# Patient Record
Sex: Female | Born: 1985
Health system: Southern US, Community
[De-identification: ages and names within clinical notes are randomized; demographics above are authoritative.]

## PROBLEM LIST (undated history)

## (undated) DIAGNOSIS — S0300XA Dislocation of jaw, unspecified side, initial encounter: Secondary | ICD-10-CM

## (undated) HISTORY — PX: TUBAL LIGATION: SHX77

---

## 2013-05-29 ENCOUNTER — Encounter (HOSPITAL_COMMUNITY): Payer: Self-pay

## 2013-05-29 ENCOUNTER — Emergency Department (HOSPITAL_COMMUNITY)
Admission: EM | Admit: 2013-05-29 | Discharge: 2013-05-29 | Disposition: A | Discharge status: Home / Self Care | Payer: Medicaid Other | Attending: Emergency Medicine | Admitting: Emergency Medicine

## 2013-05-29 ENCOUNTER — Encounter (HOSPITAL_COMMUNITY): Payer: Self-pay | Admitting: Emergency Medicine

## 2013-05-29 ENCOUNTER — Emergency Department (HOSPITAL_COMMUNITY)
Admission: EM | Admit: 2013-05-29 | Discharge: 2013-05-30 | Disposition: A | Discharge status: Home / Self Care | Payer: Medicaid Other | Source: Home / Self Care

## 2013-05-29 DIAGNOSIS — R51 Headache: Secondary | ICD-10-CM | POA: Insufficient documentation

## 2013-05-29 DIAGNOSIS — M26629 Arthralgia of temporomandibular joint, unspecified side: Secondary | ICD-10-CM | POA: Insufficient documentation

## 2013-05-29 DIAGNOSIS — F17200 Nicotine dependence, unspecified, uncomplicated: Secondary | ICD-10-CM | POA: Insufficient documentation

## 2013-05-29 DIAGNOSIS — K089 Disorder of teeth and supporting structures, unspecified: Secondary | ICD-10-CM | POA: Insufficient documentation

## 2013-05-29 DIAGNOSIS — K029 Dental caries, unspecified: Secondary | ICD-10-CM | POA: Insufficient documentation

## 2013-05-29 HISTORY — DX: Dislocation of jaw, unspecified side, initial encounter: S03.00XA

## 2013-05-29 MED ORDER — IBUPROFEN 800 MG PO TABS: 800.0000 mg | ORAL_TABLET | Freq: Three times a day (TID) | ORAL | Status: DC

## 2013-05-29 MED ORDER — HYDROCODONE-ACETAMINOPHEN 5-325 MG PO TABS: 1.0000 | ORAL_TABLET | ORAL | Status: DC | PRN

## 2013-05-29 MED ORDER — AMOXICILLIN 500 MG PO CAPS: 500.0000 mg | ORAL_CAPSULE | Freq: Three times a day (TID) | ORAL | Status: DC

## 2013-05-29 MED ORDER — HYDROCODONE-ACETAMINOPHEN 5-325 MG PO TABS
1.0000 | ORAL_TABLET | Freq: Once | ORAL | Status: AC
  Administered 2013-05-29: 1 via ORAL
  Filled 2013-05-29: qty 1

## 2013-05-29 NOTE — ED Provider Notes (Signed)
History    CSN: [id number] Arrival date & time 05/29/13  1111  First MD Initiated Contact with Patient 05/29/13 1132     No chief complaint on file.  (Consider location/radiation/quality/duration/timing/severity/associated sxs/prior Treatment) HPI  Maria Deleon is a 27 y.o.female without any significant PMH presents to the ER with complaints of right jaw pain. She does not know if it is her jaw or her tooth. It does not hurt to drink liquid but the chewing motion does hurt.   Symptoms began 3 days ago. The patient has tried to alleviate pain with OTC pain medications.  Pain rated at a 7/10, characterized as throbbing in nature and located right jaw. Patient denies fever, night sweats, chills, difficulty swallowing or opening mouth, SOB, nuchal rigidity or decreased ROM of neck.  Patient does not have a dentist and requests a resource guide at discharge.    History reviewed. No pertinent past medical history. Past Surgical History  Procedure Laterality Date  . Tubal ligation     History reviewed. No pertinent family history. History  Substance Use Topics  . Smoking status: Current Every Day Smoker -- 0.50 packs/day  . Smokeless tobacco: Not on file  . Alcohol Use: Yes   OB History   Grav Para Term Preterm Abortions TAB SAB Ect Mult Living                 Review of Systems  Review of Systems  Gen: no weight loss, fevers, chills, night sweats  Eyes: no discharge or drainage, no occular pain or visual changes  Nose: no epistaxis or rhinorrhea  Mouth: +dental pain, no sore throat  Neck: no neck pain  Lungs:No wheezing, coughing or hemoptysis CV: no chest pain, palpitations, dependent edema or orthopnea  Abd: no abdominal pain, nausea, vomiting  GU: no dysuria or gross hematuria  MSK:  No abnormalities  Neuro: no headache, no focal neurologic deficits  Skin: no abnormalities Psyche: negative.   Allergies  Review of patient's allergies indicates no known  allergies.  Home Medications   Current Outpatient Rx  Name  Route  Sig  Dispense  Refill  . amoxicillin (AMOXIL) 500 MG capsule   Oral   Take 1 capsule (500 mg total) by mouth 3 (three) times daily.   21 capsule   0   . HYDROcodone-acetaminophen (NORCO/VICODIN) 5-325 MG per tablet   Oral   Take 1 tablet by mouth every 4 (four) hours as needed for pain.   20 tablet   0   . ibuprofen (ADVIL,MOTRIN) 800 MG tablet   Oral   Take 1 tablet (800 mg total) by mouth 3 (three) times daily.   21 tablet   0    BP 118/72  Pulse 83  Temp(Src) 98.4 F (36.9 C) (Oral)  Resp 18  SpO2 97%  LMP 04/29/2013 Physical Exam  Constitutional: She appears well-developed and well-nourished. No distress.  HENT:  Head: Normocephalic and atraumatic.  Mouth/Throat: Uvula is midline, oropharynx is clear and moist and mucous membranes are normal. Normal dentition. Dental caries (Pts tooth shows no obvious abscess but moderate to severe tenderness to palpation of marked tooth) present. No edematous.  Eyes: Pupils are equal, round, and reactive to light.  Neck: Trachea normal, normal range of motion and full passive range of motion without pain. Neck supple.  Cardiovascular: Normal rate, regular rhythm, normal heart sounds and normal pulses.   Pulmonary/Chest: Effort normal and breath sounds normal. No respiratory distress. Chest wall is not dull  to percussion. She exhibits no tenderness, no crepitus, no edema, no deformity and no retraction.  Abdominal: Normal appearance.  Musculoskeletal: Normal range of motion.  Neurological: She is alert. She has normal strength.  Skin: Skin is warm, dry and intact. She is not diaphoretic.  Psychiatric: She has a normal mood and affect. Her speech is normal. Cognition and memory are normal.    ED Course  Procedures (including critical care time) Labs Reviewed - No data to display No results found. 1. TMJ arthralgia     MDM  Patient has dental pain. No emergent  s/sx's present. Patent airway. No trismus.  Will be given pain medication and antibiotics. I discussed the need to call dentist within 24/48 hours for follow-up. Dental referral given. Return to ED precautions given.  Pt voiced understanding and has agreed to follow-up.    Dorthula Matas, PA-C 05/29/13 1202

## 2013-05-29 NOTE — ED Notes (Signed)
Pt presents with 3 day h/o R sided jaw pain and "swollen gums".  Pt denies any dental caries, reports "my wisdom tooth may be coming in".

## 2013-05-29 NOTE — ED Notes (Signed)
PT. REPORTS PERSISTENT RIGHT JAW PAIN SEEN HERE THIS MORNING DIAGNOSED WITH TMJ ARTHRALGIA PRESCRIBED WITH IBUPROFEN , VICODIN AND ANTIBIOTIC WITH NO IMPROVEMENT.

## 2013-05-29 NOTE — ED Provider Notes (Signed)
Medical screening examination/treatment/procedure(s) were performed by non-physician practitioner and as supervising physician I was immediately available for consultation/collaboration.   Laranda Burkemper B. Dandre Sisler, MD 05/29/13 1525 

## 2013-05-30 ENCOUNTER — Emergency Department (HOSPITAL_COMMUNITY): Payer: Medicaid Other

## 2013-05-30 IMAGING — CT CT MAXILLOFACIAL W/O CM
3 series · 16 of 47 positions shown, 19 images · non-contrast
Comparison: None.

CLINICAL DATA: Right-sided facial pain for 3 days.  The patient is
unable to eat.

CT MAXILLOFACIAL WITHOUT CONTRAST
TECHNIQUE: Multidetector CT imaging of the maxillofacial
structures was performed. Multiplanar CT image reconstructions were
also generated.

[Series 2: facial/ orbits 2.0 h30s · axial · 0.32mm/px · z∈[-150,-22]mm · 10 of 76 slices shown, 13 images]
[im 6/76  brain]
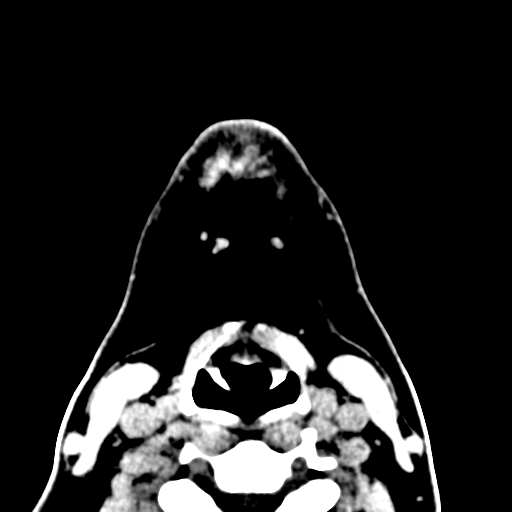
[im 6/76  bone]
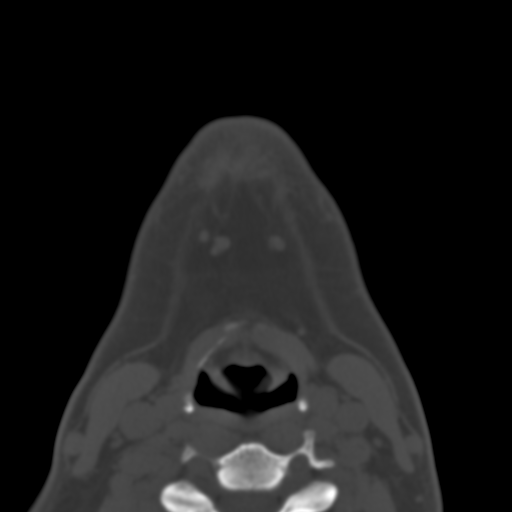
[im 13/76  bone]
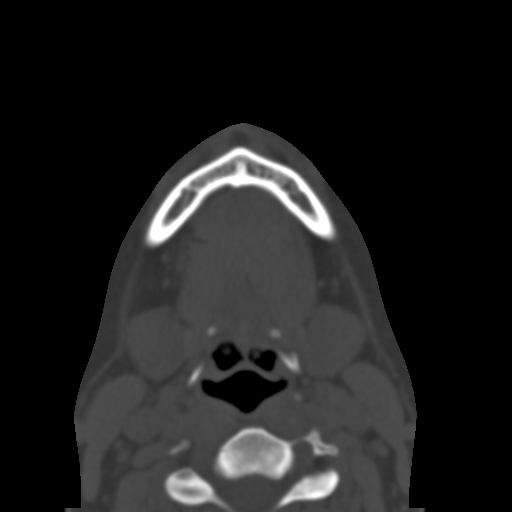
[im 21/76  bone]
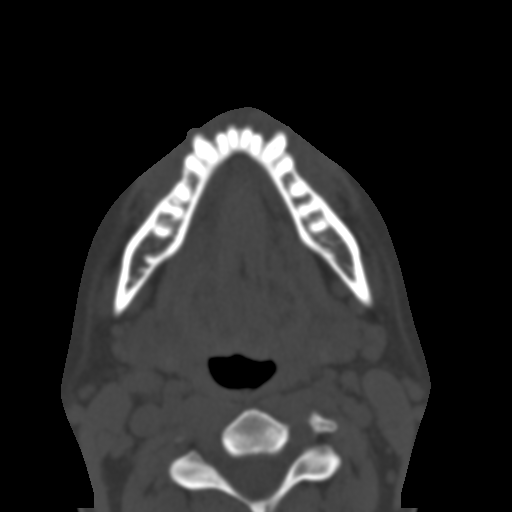
[im 26/76  bone]
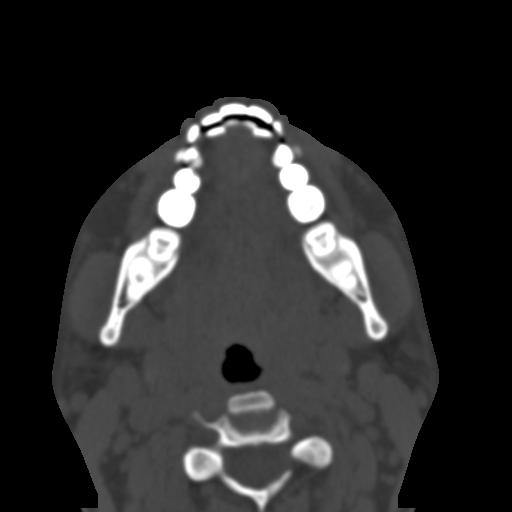
[im 34/76  brain]
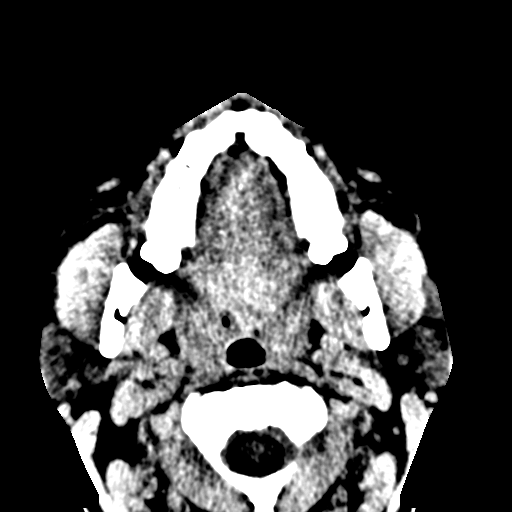
[im 34/76  bone]
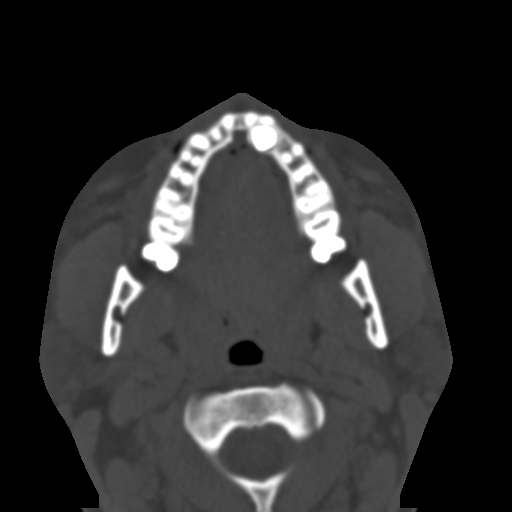
[im 42/76  bone]
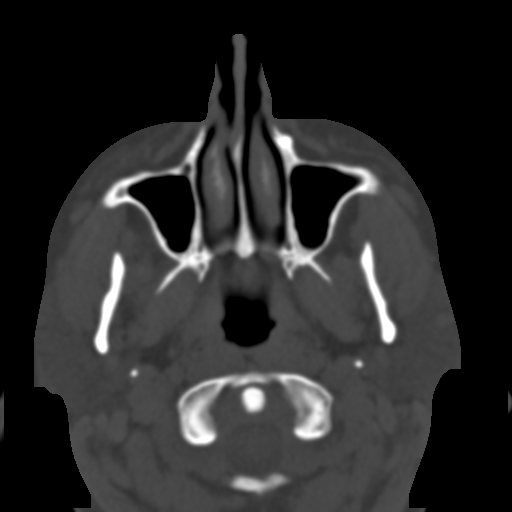
[im 50/76  bone]
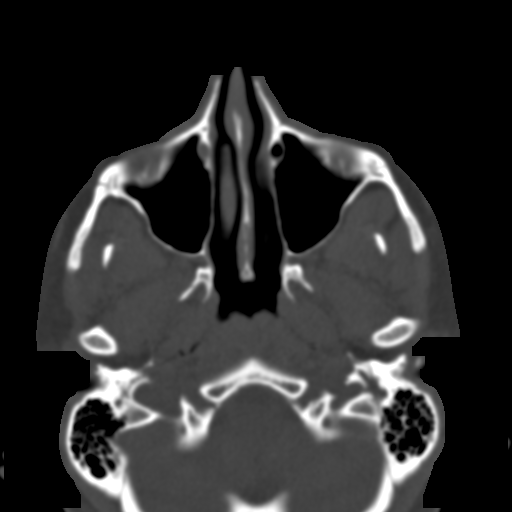
[im 57/76  bone]
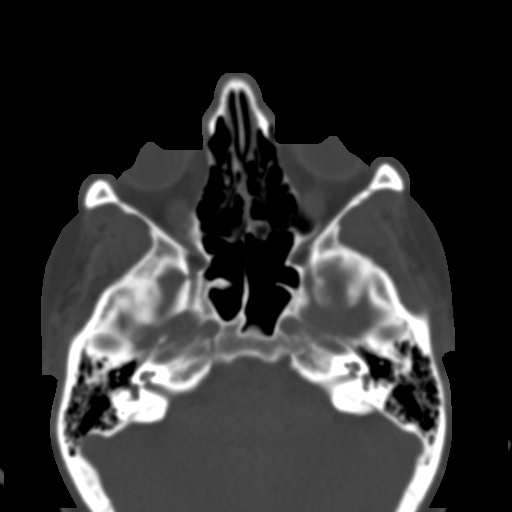
[im 63/76  brain]
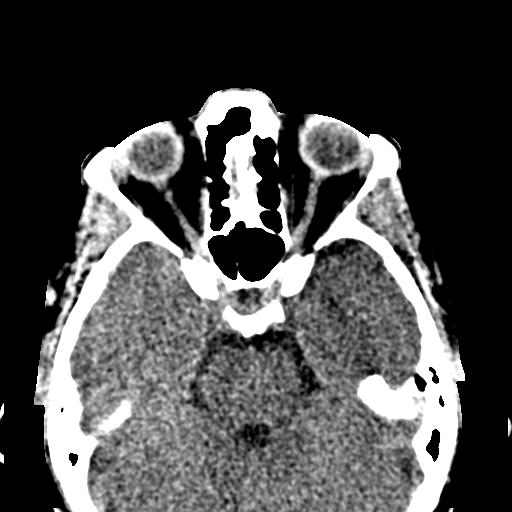
[im 63/76  bone]
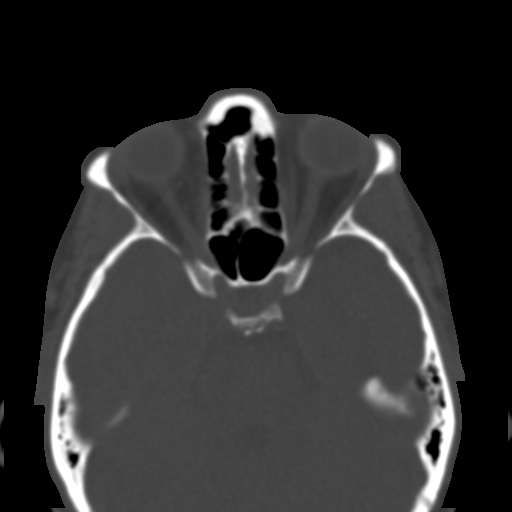
[im 70/76  bone]
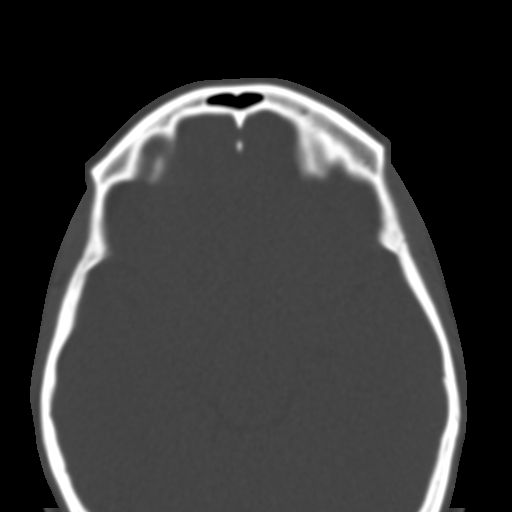

[Series 602: coronal st · coronal · 0.32mm/px · 3 of 74 slices shown]
[im 25/74  bone]
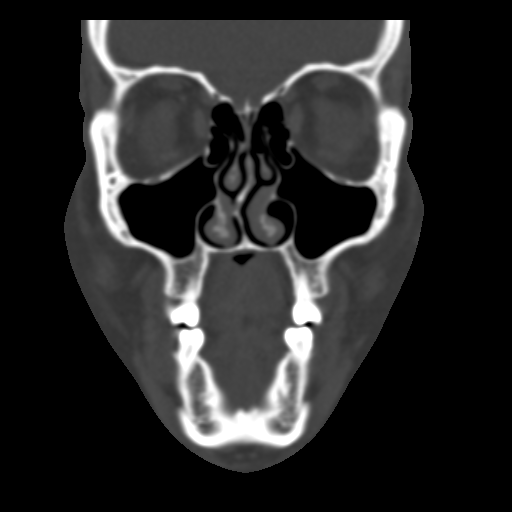
[im 33/74  bone]
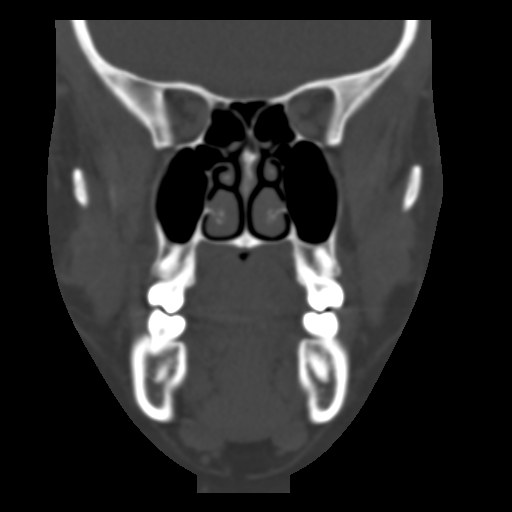
[im 41/74  bone]
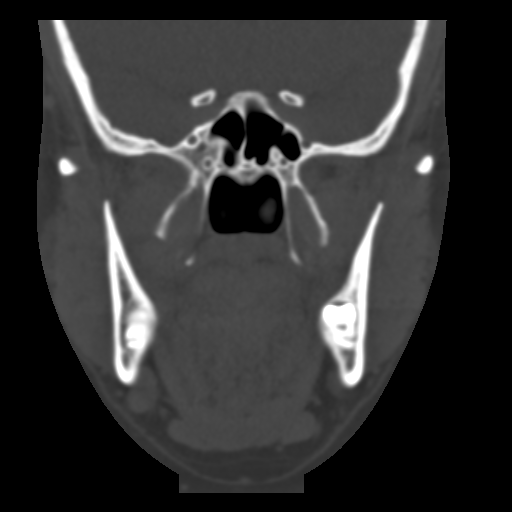

[Series 603: sag st · sagittal · 0.32mm/px · 3 of 73 slices shown]
[im 25/73  bone]
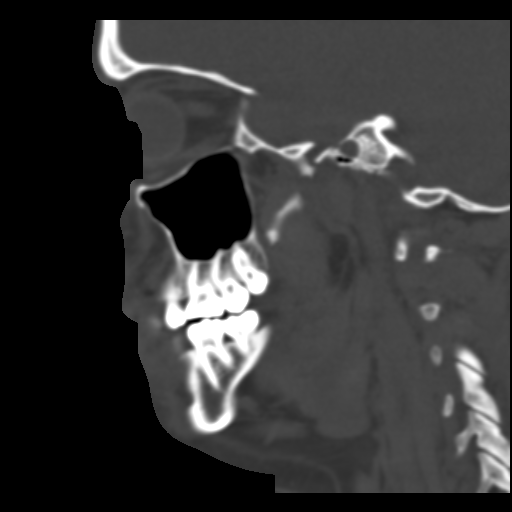
[im 37/73  bone]
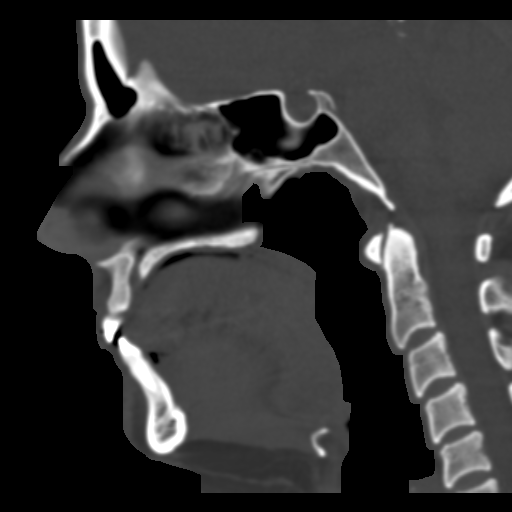
[im 49/73  bone]
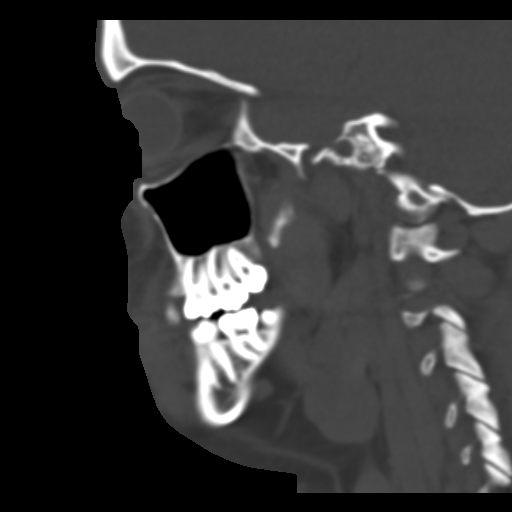

[16 of 47 positions shown; findings below may reference images not displayed]

FINDINGS: The globes and extraocular muscles appear intact and
symmetrical.  There is a small retention cyst in the right
maxillary antrum but otherwise the paranasal sinuses are clear.  No
acute air-fluid levels demonstrated.  The orbital, nasal, and
facial bones appear intact.  No displaced fractures are
appreciated.  The lower third molars are partially erupted
bilaterally.  Upper third molars appear to be erupted.  There is an
unerrupted tooth in the anterior left maxilla which probably
represents the left upper cuspid. No significant periodontal bone
erosion is suggested.  No significant soft tissue swelling.
Salivary glands appear symmetrical.  Cervical lymph nodes are not
pathologically enlarged.
IMPRESSION: No evidence of acute abscess.

## 2013-05-30 MED ORDER — OXYCODONE-ACETAMINOPHEN 5-325 MG PO TABS: 1.0000 | ORAL_TABLET | Freq: Once | ORAL | Status: DC

## 2013-05-30 MED ORDER — HYDROMORPHONE HCL PF 1 MG/ML IJ SOLN
1.0000 mg | Freq: Once | INTRAMUSCULAR | Status: AC
  Administered 2013-05-30: 1 mg via INTRAMUSCULAR
  Filled 2013-05-30: qty 1

## 2013-05-30 MED ORDER — OXYCODONE-ACETAMINOPHEN 5-325 MG PO TABS: 1.0000 | ORAL_TABLET | ORAL | Status: DC | PRN

## 2013-05-30 NOTE — ED Notes (Signed)
Pt returned from CT °

## 2013-05-30 NOTE — ED Notes (Signed)
Pt comfortable with d/c and f/u instructions. 

## 2013-05-30 NOTE — ED Provider Notes (Signed)
History    CSN: [id number] Arrival date & time 05/29/13  2151  None    Chief Complaint  Patient presents with  . Jaw Pain   (Consider location/radiation/quality/duration/timing/severity/associated sxs/prior Treatment) HPI History provided by pt and prior chart.  Pt c/o severe pain in right jaw and cheek for the past 3 days.  Pain aggravated by talking and chewing.  No associated fever, skin changes, nasal congestion, toothache, sore throat, otalgia.  Denies trauma.  Per prior chart, pt seen for same yesterday am, was clinically diagnosed w/ TMJ and d/c'd home w/ vicodin and amoxicillin.  Pt reports no relief w/ vicodin.   Past Medical History  Diagnosis Date  . TMJ (dislocation of temporomandibular joint)    Past Surgical History  Procedure Laterality Date  . Tubal ligation     No family history on file. History  Substance Use Topics  . Smoking status: Current Every Day Smoker -- 0.50 packs/day  . Smokeless tobacco: Not on file  . Alcohol Use: Yes   OB History   Grav Para Term Preterm Abortions TAB SAB Ect Mult Living                 Review of Systems  All other systems reviewed and are negative.    Allergies  Review of patient's allergies indicates no known allergies.  Home Medications   Current Outpatient Rx  Name  Route  Sig  Dispense  Refill  . amoxicillin (AMOXIL) 500 MG capsule   Oral   Take 1 capsule (500 mg total) by mouth 3 (three) times daily.   21 capsule   0   . HYDROcodone-acetaminophen (NORCO/VICODIN) 5-325 MG per tablet   Oral   Take 1 tablet by mouth every 4 (four) hours as needed for pain.   20 tablet   0   . ibuprofen (ADVIL,MOTRIN) 800 MG tablet   Oral   Take 1 tablet (800 mg total) by mouth 3 (three) times daily.   21 tablet   0   . oxyCODONE-acetaminophen (PERCOCET/ROXICET) 5-325 MG per tablet   Oral   Take 1 tablet by mouth every 4 (four) hours as needed for pain.   12 tablet   0    BP 119/74  Pulse 74  Temp(Src) 98.4 F  (36.9 C) (Oral)  Resp 16  SpO2 97%  LMP 04/29/2013 Physical Exam  Nursing note and vitals reviewed. Constitutional: She is oriented to person, place, and time. She appears well-developed and well-nourished. No distress.  HENT:  Head: Normocephalic and atraumatic.  No edema or skin changes of face.  Tenderness of entire right maxilla and mandible, including TMJ, as well as buccal mucosa and all teeth.  Mild, symmetric edema of tonsils.  Uvula mid-line.  No true trismus, but opening mouth very uncomfortable.   Eyes:  Normal appearance  Neck: Normal range of motion.  Cardiovascular: Normal rate and regular rhythm.   Pulmonary/Chest: Effort normal and breath sounds normal. No respiratory distress.  Musculoskeletal: Normal range of motion.  Lymphadenopathy:    She has no cervical adenopathy.  Neurological: She is alert and oriented to person, place, and time.  Skin: Skin is warm and dry. No rash noted.  Psychiatric: She has a normal mood and affect. Her behavior is normal.    ED Course  Procedures (including critical care time) Labs Reviewed - No data to display Ct Maxillofacial Wo Cm  05/30/2013   *RADIOLOGY REPORT*  Clinical Data: Right-sided facial pain for 3 days.  The patient is unable to eat.  CT MAXILLOFACIAL WITHOUT CONTRAST  Technique:  Multidetector CT imaging of the maxillofacial structures was performed. Multiplanar CT image reconstructions were also generated.  Comparison: None.  Findings: The globes and extraocular muscles appear intact and symmetrical.  There is a small retention cyst in the right maxillary antrum but otherwise the paranasal sinuses are clear.  No acute air-fluid levels demonstrated.  The orbital, nasal, and facial bones appear intact.  No displaced fractures are appreciated.  The lower third molars are partially erupted bilaterally.  Upper third molars appear to be erupted.  There is an unerrupted tooth in the anterior left maxilla which probably represents the  left upper cuspid. No significant periodontal bone erosion is suggested.  No significant soft tissue swelling. Salivary glands appear symmetrical.  Cervical lymph nodes are not pathologically enlarged.  IMPRESSION: No evidence of acute abscess.   Original Report Authenticated By: Burman Nieves, M.D.   1. Facial pain     MDM  27yo F presents for second time in 24 hours w/ non-traumatic, right-sided facial pain.  No significant findings on exam.  Discussed w/ Dr. Dierdre Highman who recommended CT maxillofacial to look for obvious sinusitis.  Non-acute.  Results discussed w/ pt.  She had some relief of pain w/ IM dialudid.  On re-examination, tenderness most prominent at right parotid gland duct.  No obvious stone or inflammatory process on CT, but recommended warm compresses, sour candy and massage.  Prescribed 12 percocet to take in place of vicodin (has given her no relief; pt very reasonable) and referred to ENT.  Return precautions discussed.   Otilio Miu, PA-C 05/30/13 [phone]

## 2013-06-01 NOTE — ED Provider Notes (Signed)
Medical screening examination/treatment/procedure(s) were performed by non-physician practitioner and as supervising physician I was immediately available for consultation/collaboration.  Naoki Migliaccio, MD 06/01/13 0719 

## 2013-07-31 DIAGNOSIS — G4700 Insomnia, unspecified: Secondary | ICD-10-CM | POA: Insufficient documentation

## 2013-07-31 DIAGNOSIS — M722 Plantar fascial fibromatosis: Secondary | ICD-10-CM | POA: Insufficient documentation

## 2013-08-14 DIAGNOSIS — R5383 Other fatigue: Secondary | ICD-10-CM | POA: Insufficient documentation

## 2013-08-14 DIAGNOSIS — R768 Other specified abnormal immunological findings in serum: Secondary | ICD-10-CM | POA: Insufficient documentation

## 2013-08-14 DIAGNOSIS — M2550 Pain in unspecified joint: Secondary | ICD-10-CM | POA: Insufficient documentation

## 2013-09-25 DIAGNOSIS — M064 Inflammatory polyarthropathy: Secondary | ICD-10-CM | POA: Insufficient documentation

## 2013-09-25 DIAGNOSIS — M25531 Pain in right wrist: Secondary | ICD-10-CM | POA: Insufficient documentation

## 2013-09-25 DIAGNOSIS — M659 Synovitis and tenosynovitis, unspecified: Secondary | ICD-10-CM | POA: Insufficient documentation

## 2014-03-12 ENCOUNTER — Encounter (HOSPITAL_COMMUNITY): Payer: Self-pay | Admitting: Emergency Medicine

## 2014-03-12 ENCOUNTER — Emergency Department (HOSPITAL_COMMUNITY): Payer: Medicaid Other

## 2014-03-12 ENCOUNTER — Emergency Department (HOSPITAL_COMMUNITY)
Admission: EM | Admit: 2014-03-12 | Discharge: 2014-03-12 | Disposition: A | Discharge status: Home / Self Care | Payer: Medicaid Other | Attending: Emergency Medicine | Admitting: Emergency Medicine

## 2014-03-12 DIAGNOSIS — R091 Pleurisy: Secondary | ICD-10-CM | POA: Insufficient documentation

## 2014-03-12 DIAGNOSIS — Z79899 Other long term (current) drug therapy: Secondary | ICD-10-CM | POA: Insufficient documentation

## 2014-03-12 DIAGNOSIS — F17200 Nicotine dependence, unspecified, uncomplicated: Secondary | ICD-10-CM | POA: Insufficient documentation

## 2014-03-12 DIAGNOSIS — G479 Sleep disorder, unspecified: Secondary | ICD-10-CM | POA: Insufficient documentation

## 2014-03-12 DIAGNOSIS — Z87828 Personal history of other (healed) physical injury and trauma: Secondary | ICD-10-CM | POA: Insufficient documentation

## 2014-03-12 IMAGING — CR DG CHEST 2V
2 series · 2 of 2 positions shown · non-contrast
Comparison: None.

CLINICAL DATA: Right flank pain, left shoulder pain

EXAM:
CHEST  2 VIEW

[w chest pa]
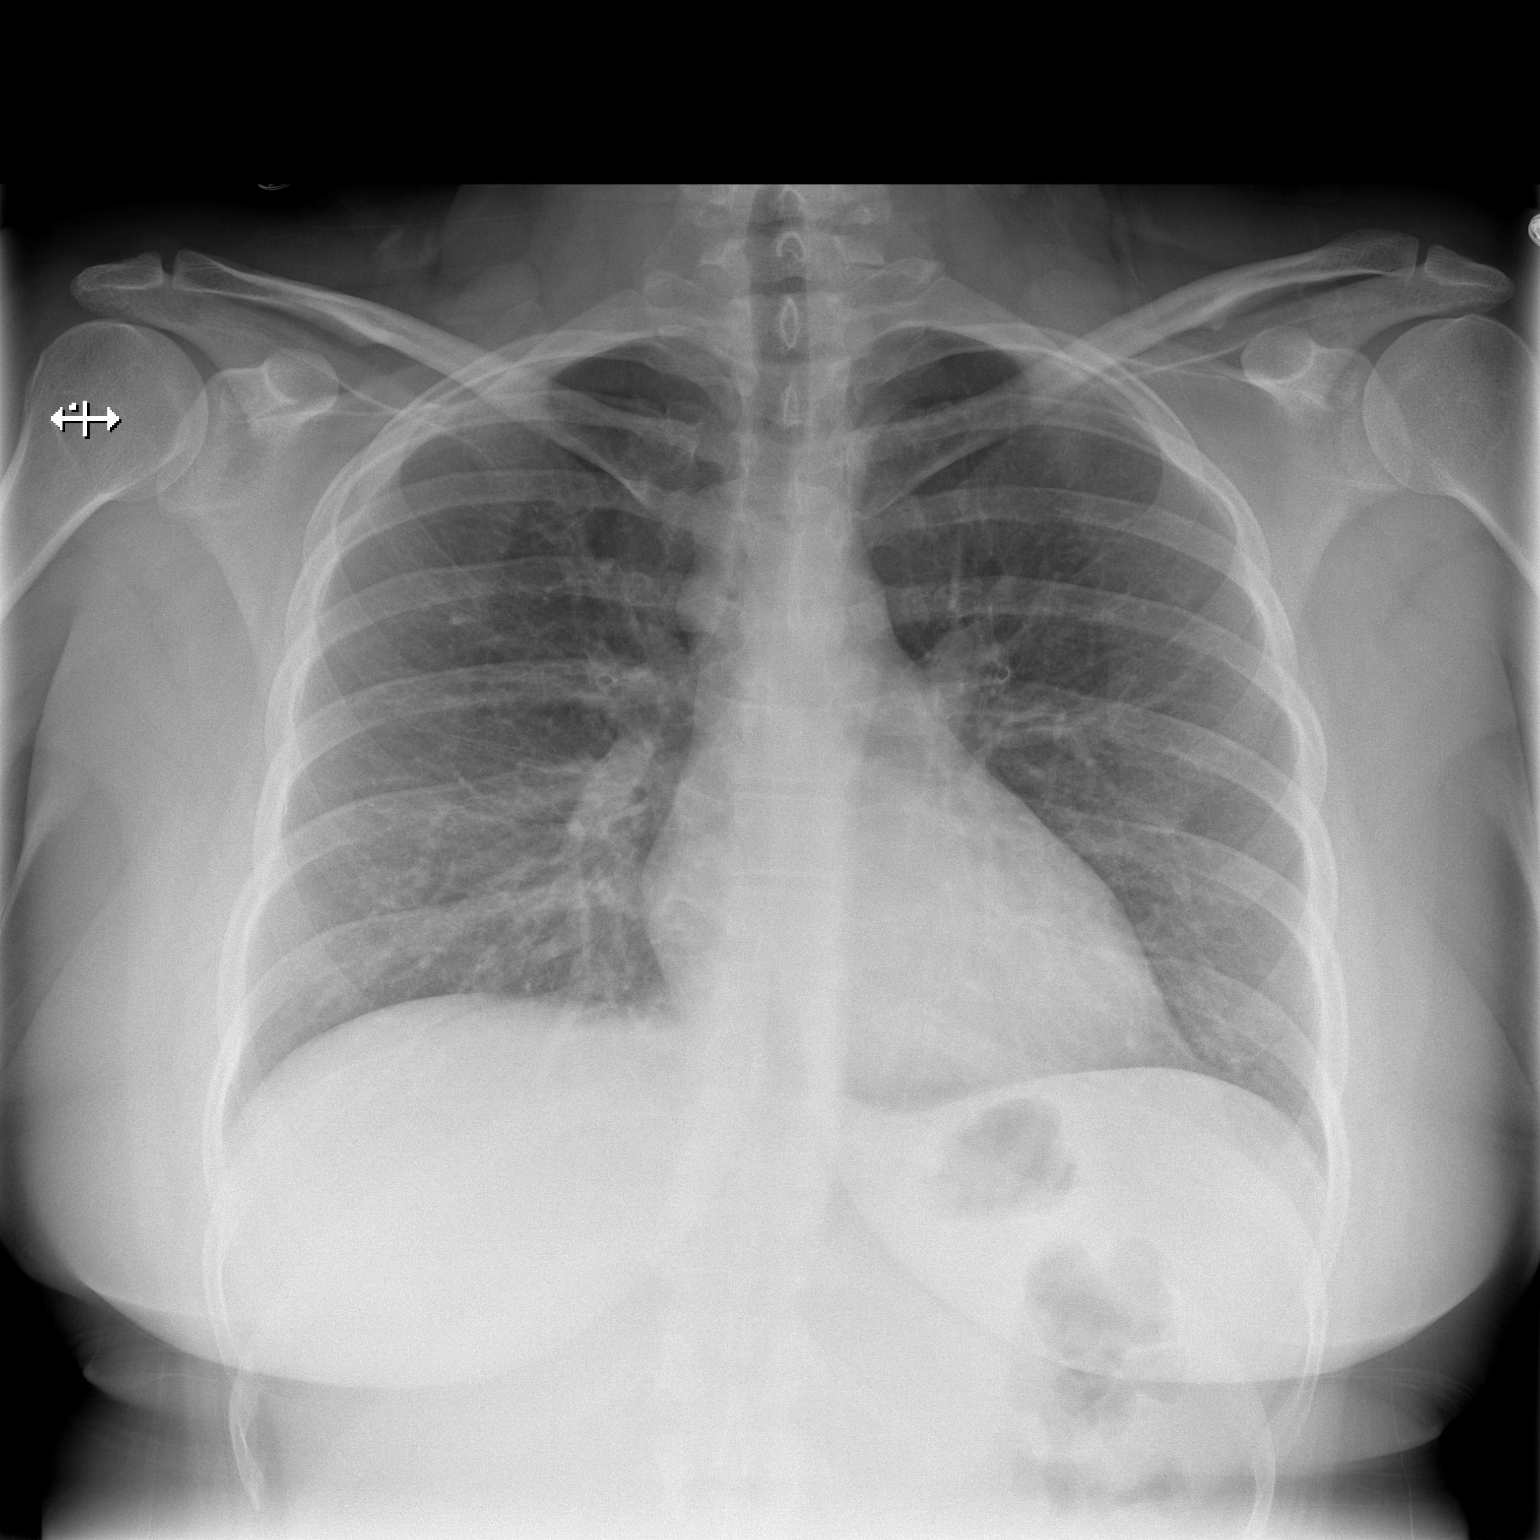

[w chest lat]
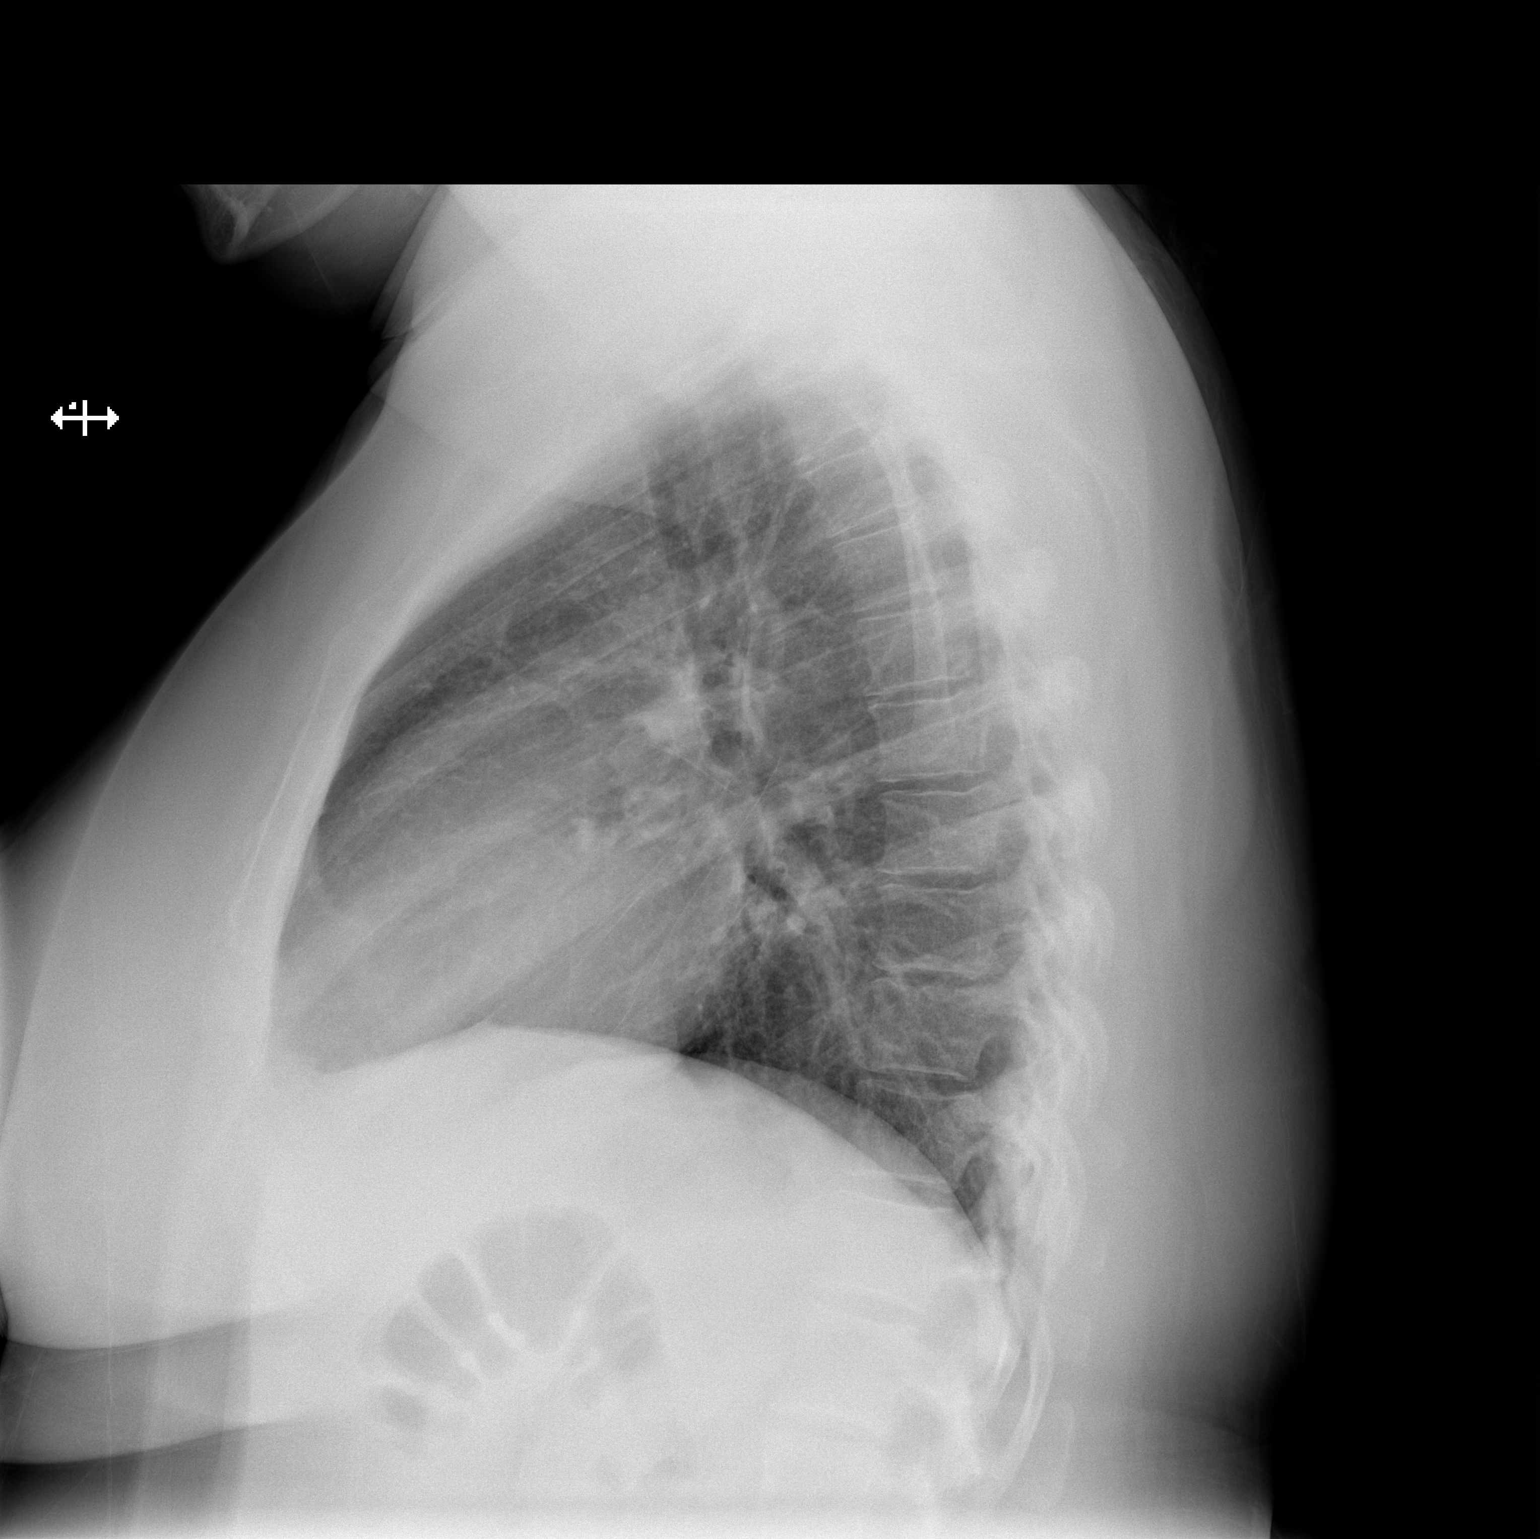

[2 of 2 positions shown; findings below may reference images not displayed]

FINDINGS: The heart size and mediastinal contours are within normal limits.
Both lungs are clear. The visualized skeletal structures are
unremarkable.
IMPRESSION: No active cardiopulmonary disease.

## 2014-03-12 MED ORDER — HYDROCOD POLST-CHLORPHEN POLST 10-8 MG/5ML PO LQCR: 5.0000 mL | Freq: Two times a day (BID) | ORAL | Status: DC

## 2014-03-12 NOTE — Discharge Instructions (Signed)
Pleurisy Pleurisy is redness, puffiness (swelling), and soreness (inflammation) of the lining of the lungs. It can be hard to breathe and hurt to breathe. Coughing or deep breathing will make it hurt more. It is often caused by an existing infection or disease.  HOME CARE  Only take medicine as told by your doctor.  Only take antibiotic medicine as directed. Make sure to finish it even if you start to feel better. GET HELP RIGHT AWAY IF:   Your lips, fingernails, or toenails are blue or dark.  You cough up blood.  You have a hard time breathing.  Your pain is not controlled with medicine or it lasts for more than 1 week.  Your pain spreads (radiates) into your neck, arms, or jaw.  You are short of breath or wheezing.  You develop a fever, rash, throw up (vomit), or faint. MAKE SURE YOU:   Understand these instructions.  Will watch your condition.  Will get help right away if you are not doing well or get worse. Document Released: 10/19/2008 Document Revised: 07/09/2013 Document Reviewed: 04/20/2013 [facility] Patient Information 2014 [place], [place].  Emergency Department Resource Guide 1) Find a Doctor and Pay Out of Pocket Although you won't have to find out who is covered by your insurance plan, it is a good idea to ask around and get recommendations. You will then need to call the office and see if the doctor you have chosen will accept you as a new patient and what types of options they offer for patients who are self-pay. Some doctors offer discounts or will set up payment plans for their patients who do not have insurance, but you will need to ask so you aren't surprised when you get to your appointment.  2) Contact Your Local Health Department Not all health departments have doctors that can see patients for sick visits, but many do, so it is worth a call to see if yours does. If you don't know where your local health department is, you can check in your phone book. The CDC  also has a tool to help you locate your state's health department, and many state websites also have listings of all of their local health departments.  3) Find a Walk-in Clinic If your illness is not likely to be very severe or complicated, you may want to try a walk in clinic. These are popping up all over the country in pharmacies, drugstores, and shopping centers. They're usually staffed by nurse practitioners or physician assistants that have been trained to treat common illnesses and complaints. They're usually fairly quick and inexpensive. However, if you have serious medical issues or chronic medical problems, these are probably not your best option.  No Primary Care Doctor: - Call Health Connect at  [phone] - they can help you locate a primary care doctor that  accepts your insurance, provides certain services, etc. - Physician Referral Service- [phone]  Chronic Pain Problems: Organization         Address  Phone   Notes  [facility]  [phone] Patients need to be referred by their primary care doctor.   Medication Assistance: Organization         Address  Phone   Notes  [facility] [address] [place], [place] [address] [phone] --Must be a resident of [facility] [place] -- Must have NO insurance coverage whatsoever (no Medicaid/ Medicare, etc.) -- The pt. MUST have a primary care  doctor that directs their care regularly and follows them in the community   MedAssist  [phone]   [facility]  [phone]    Agencies that provide inexpensive medical care: Organization         Address  Phone   Notes  [facility]  [phone]   [facility]    [phone]   [facility] [address] [place], [place] [address] [phone]   [facility] [address] [place] [place] [phone]   Planned Parenthood    [phone]   [facility]    [phone]   [facility]  [address], [place] Phone:  [phone], Fax:  [phone] Hours of Operation:  9 am - 6 pm, M-F.  Also accepts Medicaid/Medicare and self-pay.  [facility]  [address], [place] Phone: [phone], Fax: [phone]. Hours of Operation:  8:30 am - 5:30 pm, M-F.  Also accepts Medicaid and self-pay.  [facility] [place] [address], [place] Phone: [phone]   [facility] [address] Natasha Bence [place], [place] [phone], Ext. [phone] Mondays & Thursdays: 7-9 AM.  First 15 patients are seen on a first come, first serve basis.    Medicaid-accepting [facility] Providers:  Organization         Address  Phone   Notes  [facility] [address], [place] [phone] Also accepts self-pay patients.  [facility] [address] Laurell Josephs [place], [place]  [phone]   [facility] [address], [place] [phone]   [facility] [address], [place] [phone]   Renaye Rakers [address], [place]   [phone] Only accepts [place] patients after they have their name applied to their card.   Self-Pay (no insurance) in [facility]:  Organization         Address  Phone   Notes  Sickle Cell Patients, [facility] [address] [place], [place] [phone]   [facility] [address] [place], [place] [phone]   [facility] [place]  [address], [place] [phone]   [facility]/Dr. Osei-Bonsu  [address], [place] or [address], [place] [phone] Phone number for both [place] and [place] locations is the same.  [facility] [address] [place] [phone]   [facility] [address] [place] or [address] [phone]   [facility] [address] [place] [phone], phone; [phone], fax Sees patients 1st and 3rd Saturday of every month.  Must not qualify for public or private insurance (i.e. Medicaid, Medicare, [place] Health Choice, Veterans' Benefits)  Household income should be no more than 200% of the poverty level The clinic cannot treat you if you are pregnant or think you are pregnant  Sexually transmitted diseases are not treated at the clinic.    Dental Care: Organization         Address  Phone  Notes  [facility] Department of [facility] [address] [place], [place] [phone] Accepts  children up to age 28 who are enrolled in Medicaid or [place] Health Choice; pregnant women with a Medicaid card; and children who have applied for Medicaid or [place] Health Choice, but were declined, whose parents can pay a reduced fee at time of service.  [facility] of [facility] [place]  [address], [place] [phone] Accepts children up to age 821 who are enrolled in [place] or [place] Health Choice; pregnant women with a Medicaid card; and children who have applied for Medicaid or [place] Health Choice, but were declined, whose parents can pay a reduced fee at time of service.  [facility]  [address], [place] [phone] Patients are seen by appointment only. Walk-ins are not accepted. [facility] will see patients 28 years of age and older. Monday - Tuesday (8am-5pm) Most Wednesdays (8:30-5pm) $30 per visit, cash only  [facility]  [address], [place] [facility] [phone] Patients are seen by appointment only. Walk-ins are not accepted. [facility] will see patients 28 years of age and older. One  Wednesday Evening (Monthly: Volunteer Based).  $30 per visit, cash only  [facility]  [phone]) [phone] for adults; Children under age 364, call Graduate Pediatric Dentistry at [phone]. Children aged 84-14, please call [phone] to request a pediatric application.  Dental services are provided in all areas of dental care including fillings, crowns and bridges, complete and partial dentures, implants, gum treatment, root canals, and extractions. Preventive care is also provided. Treatment is provided to both adults and children. Patients are selected via a lottery and there is often a waiting list.   [facility] [address], [place]  [phone] [email or web address]   [facility] [address], [place], [place] [phone], Ext. [phone] Second and Fourth Thursday of each month, opens at 6:30 AM; Clinic ends at 9 AM.  Patients are seen on a first-come first-served basis, and a limited number are seen during each clinic.   [facility]  [address], [place], [place] [phone]   Eligibility Requirements You must have lived in [place], [place], or [place] counties for at least the last three months.   You cannot be eligible for state or federal sponsored [place] Cityhealthcare insurance, including CIGNAVeterans Administration, [place]icaid, or [employer] EntertainmentMedicare.   You generally cannot be eligible for healthcare insurance through your employer.    How to apply: Eligibility screenings are held every Tuesday and Wednesday afternoon from 1:00 pm until 4:00 pm. You do not need an appointment for the interview!  [facility] [address], [place], [place] [phone]   [facility]  [phone]   [facility]  [phone]   [facility]  [phone]    Behavioral Health Resources in the Community: Intensive Outpatient Programs Organization          Address  Phone  Notes  [facility] [address], [place], [place] [phone]   [facility] [address], [place], [place] [phone]   [facility] [address], [place], [place]  [phone]   [facility] [address],  [place], [place] [phone] or [phone]   Substance Abuse Resources Organization         Address  Phone  Notes  [facility] Drug Services  [phone]  [facility]  [phone]   [facility]  [phone]   Floydene Flock  [phone]   Residential & Outpatient Substance Abuse Program  [phone]   Psychological Services Organization         Address  Phone  Notes  [facility]  [phone]   [facility]  [phone]   [facility] [address], [place] [phone] or [phone]    Mobile Crisis Teams Organization         Address  Phone  Notes  [facility], Mobile Crisis Care Unit  [phone]   [facility]  [address] [place], [place] [phone]   Doristine Locks [address] [place] [phone]    Self-Help/Support Groups Organization         Address  Phone             Notes  [facility] [place] - variety of support groups  [phone] Call for more information  Narcotics Anonymous (NA), [facility] [address], [employer] [place]  2 meetings at this location   Statistician         Address  Phone  Notes  [facility] [address],    [place]  [phone]   [facility]  [address] [place] [address], [place], [place] [phone]   [facility] [address] [place], [place] [phone] Admissions: 8am-3pm M-F  [facility] [address]    [place], [place] [phone]   [facility] [address] [place], [place], [place] [phone]   [facility] [address]  [place], [place] [phone]   [facility] - Intensive Outpatient [address] Laurell Josephs [address], [place], [place] [phone]   [facility] ([facility] [address] [place].,  [place], [place] [phone] or [phone]   [facility] ([facility]) [address] [place], [place] [phone] Accepts Medicaid  [facility] [address]  [place] [phone] Substance Abuse/Addiction Treatment   [facility] Organization         Address  Phone  Notes  [facility]  [phone]   Angie Fava, PhD [address] Ervin Knack [place], [place]   [phone] or [phone]   [facility] Behavioral   [address] [place], [place] [phone]   [facility] [address], [place], [place] [phone] Insurance/Medicaid/sponsorship through [facility] and Families [address]                                    [place], [place] [phone] Therapy/tele-psych/case  [facility] [address], [place] [phone]    Dr. Lolly Mustache  [phone]   [facility]  [facility] Dept. 1) [address], [place] 2) [address], [place] 3)  [address], [place] [phone]  [phone]   [facility] Child Abuse Hotline [phone] or [phone] (After Hours)

## 2014-03-12 NOTE — ED Notes (Signed)
Pt reports pain under right scapula x 1 week, increases with movement and breathing. Denies injury to back. Recent cough, productive with brown sputum.

## 2014-03-12 NOTE — ED Provider Notes (Signed)
CSN: [id number]     Arrival date & time 03/12/14  [id number] History  This chart was scribed for non-physician practitioner working with Maria Deleon , by Tana ConchStephen Methvin ED Scribe. This patient was seen in TR07C/TR07C and the patient's care was started at 9:45 AM.     Chief Complaint  Patient presents with  . Back Pain      The history is provided by the patient. No language interpreter was used.    HPI Comments: Maria Deleon is a 28 y.o. female who presents to the Emergency Department complaining of "really bad pain" in her right upper back under her right scapula, it just suddenly got worse while she was sleeping. She states that she has not had any injury that could have caused this pain.The pain began when she had a bad cold a week ago and has since resolved. The pain is worsened by movement and breathing. Her cough and the pain have disturbed her sleep. She has never had a PE or DVT. No exogenous estrogen. No hemoptysis. No recent surgeries or long trips. She denies a h/o such pain, fevers, sore throat, n/v/d, and chills. Pt current 0.5 PPD smoker.   Pt also reports a productive cough with brown sputum, the cough is productive at "times". She reports that the cough is constant, not worse at any particular time.  Past Medical History  Diagnosis Date  . TMJ (dislocation of temporomandibular joint)    Past Surgical History  Procedure Laterality Date  . Tubal ligation     History reviewed. No pertinent family history. History  Substance Use Topics  . Smoking status: Current Every Day Smoker -- 0.50 packs/day  . Smokeless tobacco: Not on file  . Alcohol Use: Yes   OB History   Grav Para Term Preterm Abortions TAB SAB Ect Mult Living                 Review of Systems  Constitutional: Negative for fever and chills.  HENT: Positive for congestion.   Respiratory: Positive for cough.   Gastrointestinal: Negative for nausea, vomiting and diarrhea.  Musculoskeletal: Positive  for back pain and myalgias.  Psychiatric/Behavioral: Positive for sleep disturbance (cough).  All other systems reviewed and are negative.     Allergies  Review of patient's allergies indicates no known allergies.  Home Medications   Prior to Admission medications   Medication Sig Start Date End Date Taking? Authorizing Provider  busPIRone (BUSPAR) 10 MG tablet Take 10 mg by mouth 2 (two) times daily.   Yes Historical Provider, MD  DULoxetine (CYMBALTA) 30 MG capsule Take 30 mg by mouth daily.   Yes Historical Provider, MD   BP 117/62  Pulse 86  Temp(Src) 97.4 F (36.3 C) (Oral)  Resp 18  SpO2 99% Physical Exam  Nursing note and vitals reviewed. Constitutional: She is oriented to person, place, and time. She appears well-developed and well-nourished. No distress.  HENT:  Head: Normocephalic and atraumatic.  Right Ear: External ear normal.  Left Ear: External ear normal.  Nose: Nose normal.  Mouth/Throat: Oropharynx is clear and moist.  Eyes: Conjunctivae are normal.  Neck: Normal range of motion.  Cardiovascular: Normal rate, regular rhythm, normal heart sounds, intact distal pulses and normal pulses.   Pulses:      Radial pulses are 2+ on the right side, and 2+ on the left side.       Posterior tibial pulses are 2+ on the right side, and 2+ on the left side.  Pulmonary/Chest: Effort normal and breath sounds normal. No stridor. No respiratory distress. She has no wheezes. She has no rales.  Abdominal: Soft. She exhibits no distension.  Musculoskeletal: Normal range of motion.       Back:  Exquisitely tender to palpation right upper back in to right flank.  Neurological: She is alert and oriented to person, place, and time. She has normal strength.  Skin: Skin is warm and dry. She is not diaphoretic. No erythema.  Psychiatric: She has a normal mood and affect. Her behavior is normal.    ED Course  Procedures (including critical care time)  DIAGNOSTIC  STUDIES: Oxygen Saturation is 99% on RA, normal by my interpretation.    COORDINATION OF CARE:   9:53 AM-Discussed treatment plan which includes establishing a PCP, XRAY with pt at bedside and pt agreed to plan.   Labs Review Labs Reviewed - No data to display  Imaging Review Dg Chest 2 View  03/12/2014   CLINICAL DATA:  Right flank pain, left shoulder pain  EXAM: CHEST  2 VIEW  COMPARISON:  None.  FINDINGS: The heart size and mediastinal contours are within normal limits. Both lungs are clear. The visualized skeletal structures are unremarkable.  IMPRESSION: No active cardiopulmonary disease.   Electronically Signed   By: Elige KoHetal  Patel   On: 03/12/2014 09:21     EKG Interpretation None      MDM   Final diagnoses:  Pleurisy   Patient presents to ED for evaluation of pleuritic chest pain on right. Pain is worse with palpation. PERC and Wells negative. Chest XR shows no active cardiopulmomary disease, no widened mediastinum. Patient with pleurisy. Will given tussionex and have patient f/u with PCP. Discussed case with Dr. Radford PaxBeaton who agrees with plan. Return instructions given. Vital signs stable for discharge.    I personally performed the services described in this documentation, which was scribed in my presence. The recorded information has been reviewed and is accurate.      Mora BellmanHannah S Davier Tramell, PA-C 03/12/14 1453

## 2014-03-14 NOTE — ED Provider Notes (Signed)
Medical screening examination/treatment/procedure(s) were performed by non-physician practitioner and as supervising physician I was immediately available for consultation/collaboration.    Burma Ketcher L Kaylob Wallen, MD 03/14/14 1004 

## 2014-06-12 ENCOUNTER — Encounter (HOSPITAL_COMMUNITY): Payer: Self-pay | Admitting: Emergency Medicine

## 2014-06-12 ENCOUNTER — Emergency Department (HOSPITAL_COMMUNITY)
Admission: EM | Admit: 2014-06-12 | Discharge: 2014-06-12 | Disposition: A | Discharge status: Home / Self Care | Payer: Medicaid Other | Source: Home / Self Care | Attending: Family Medicine | Admitting: Family Medicine

## 2014-06-12 DIAGNOSIS — H9201 Otalgia, right ear: Secondary | ICD-10-CM

## 2014-06-12 DIAGNOSIS — H9209 Otalgia, unspecified ear: Secondary | ICD-10-CM

## 2014-06-12 MED ORDER — CIPROFLOXACIN-DEXAMETHASONE 0.3-0.1 % OT SUSP: 4.0000 [drp] | Freq: Two times a day (BID) | OTIC | Status: DC

## 2014-06-12 MED ORDER — ANTIPYRINE-BENZOCAINE 5.4-1.4 % OT SOLN: 3.0000 [drp] | OTIC | Status: DC | PRN

## 2014-06-12 NOTE — ED Provider Notes (Signed)
Maria Deleon is a 28 y.o. female who presents to Urgent Care today for right ear pain. Patient is a three-day history of moderate right ear pain. No fevers or chills nausea vomiting or diarrhea. No injury. No change in hearing. No medications tried yet.   Past Medical History  Diagnosis Date  . TMJ (dislocation of temporomandibular joint)    History  Substance Use Topics  . Smoking status: Current Every Day Smoker -- 0.50 packs/day  . Smokeless tobacco: Not on file  . Alcohol Use: Yes   ROS as above Medications: No current facility-administered medications for this encounter.   Current Outpatient Prescriptions  Medication Sig Dispense Refill  . antipyrine-benzocaine (AURALGAN) otic solution Place 3-4 drops into the right ear every 2 (two) hours as needed for ear pain.  10 mL  0  . busPIRone (BUSPAR) 10 MG tablet Take 10 mg by mouth 2 (two) times daily.      . chlorpheniramine-HYDROcodone (TUSSIONEX PENNKINETIC ER) 10-8 MG/5ML LQCR Take 5 mLs by mouth 2 (two) times daily.  115 mL  0  . ciprofloxacin-dexamethasone (CIPRODEX) otic suspension Place 4 drops into the right ear 2 (two) times daily.  7.5 mL  0  . DULoxetine (CYMBALTA) 30 MG capsule Take 30 mg by mouth daily.        Exam:  BP 111/74  Pulse 62  Temp(Src) 98.4 F (36.9 C) (Oral)  Resp 16  SpO2 98%  LMP 06/12/2014 Gen: Well NAD HEENT: EOMI,  MMM, the right ear is painful with motion. The ear canal is mildly erythematous. The tympanic membrane is normal-appearing. The left ear is normal-appearing. Mastoids are nontender bilaterally. Lungs: Normal work of breathing. CTABL Heart: RRR no MRG Abd: NABS, Soft. Nondistended, Nontender Exts: Brisk capillary refill, warm and well perfused.   No results found for this or any previous visit (from the past 24 hour(s)). No results found.  Assessment and Plan: 28 y.o. female with right ear otitis externa most likely explanation. Plan to treat with Ciprodex and Auralgan.  Followup as needed.  Discussed warning signs or symptoms. Please see discharge instructions. Patient expresses understanding.   This note was created using Conservation officer, historic buildingsDragon voice recognition software. Any transcription errors are unintended.    Rodolph BongEvan S Chinenye Katzenberger, MD 06/12/14 [id number]

## 2014-06-12 NOTE — ED Notes (Signed)
C/o right ear pain for three days  Tylenol was taking as tx

## 2014-06-12 NOTE — Discharge Instructions (Signed)
Thank you for coming in today. Use the drops.  Come back if not better.   Otitis Externa Otitis externa is a bacterial or fungal infection of the outer ear canal. This is the area from the eardrum to the outside of the ear. Otitis externa is sometimes called "swimmer's ear." CAUSES  Possible causes of infection include:  Swimming in dirty water.  Moisture remaining in the ear after swimming or bathing.  Mild injury (trauma) to the ear.  Objects stuck in the ear (foreign body).  Cuts or scrapes (abrasions) on the outside of the ear. SIGNS AND SYMPTOMS  The first symptom of infection is often itching in the ear canal. Later signs and symptoms may include swelling and redness of the ear canal, ear pain, and yellowish-white fluid (pus) coming from the ear. The ear pain may be worse when pulling on the earlobe. DIAGNOSIS  Your health care provider will perform a physical exam. A sample of fluid may be taken from the ear and examined for bacteria or fungi. TREATMENT  Antibiotic ear drops are often given for 10 to 14 days. Treatment may also include pain medicine or corticosteroids to reduce itching and swelling. HOME CARE INSTRUCTIONS   Apply antibiotic ear drops to the ear canal as prescribed by your health care provider.  Take medicines only as directed by your health care provider.  If you have diabetes, follow any additional treatment instructions from your health care provider.  Keep all follow-up visits as directed by your health care provider. PREVENTION   Keep your ear dry. Use the corner of a towel to absorb water out of the ear canal after swimming or bathing.  Avoid scratching or putting objects inside your ear. This can damage the ear canal or remove the protective wax that lines the canal. This makes it easier for bacteria and fungi to grow.  Avoid swimming in lakes, polluted water, or poorly chlorinated pools.  You may use ear drops made of rubbing alcohol and vinegar  after swimming. Combine equal parts of white vinegar and alcohol in a bottle. Put 3 or 4 drops into each ear after swimming. SEEK MEDICAL CARE IF:   You have a fever.  Your ear is still red, swollen, painful, or draining pus after 3 days.  Your redness, swelling, or pain gets worse.  You have a severe headache.  You have redness, swelling, pain, or tenderness in the area behind your ear. MAKE SURE YOU:   Understand these instructions.  Will watch your condition.  Will get help right away if you are not doing well or get worse. Document Released: 11/06/2005 Document Revised: 03/23/2014 Document Reviewed: 11/23/2011 [facility] Patient Information 2015 [place], [place]. This information is not intended to replace advice given to you by your health care provider. Make sure you discuss any questions you have with your health care provider.

## 2015-02-12 ENCOUNTER — Other Ambulatory Visit (HOSPITAL_COMMUNITY)
Admission: RE | Admit: 2015-02-12 | Discharge: 2015-02-12 | Disposition: A | Discharge status: Home / Self Care | Payer: Medicaid Other | Source: Ambulatory Visit | Attending: Family Medicine | Admitting: Family Medicine

## 2015-02-12 ENCOUNTER — Emergency Department (HOSPITAL_COMMUNITY)
Admission: EM | Admit: 2015-02-12 | Discharge: 2015-02-12 | Disposition: A | Discharge status: Home / Self Care | Payer: Medicaid Other | Source: Home / Self Care | Attending: Family Medicine | Admitting: Family Medicine

## 2015-02-12 ENCOUNTER — Encounter (HOSPITAL_COMMUNITY): Payer: Self-pay | Admitting: Emergency Medicine

## 2015-02-12 DIAGNOSIS — Z113 Encounter for screening for infections with a predominantly sexual mode of transmission: Secondary | ICD-10-CM | POA: Insufficient documentation

## 2015-02-12 DIAGNOSIS — K529 Noninfective gastroenteritis and colitis, unspecified: Secondary | ICD-10-CM

## 2015-02-12 DIAGNOSIS — R197 Diarrhea, unspecified: Secondary | ICD-10-CM

## 2015-02-12 DIAGNOSIS — N730 Acute parametritis and pelvic cellulitis: Secondary | ICD-10-CM

## 2015-02-12 DIAGNOSIS — N760 Acute vaginitis: Secondary | ICD-10-CM | POA: Insufficient documentation

## 2015-02-12 LAB — POCT URINALYSIS DIP (DEVICE)
BILIRUBIN URINE: NEGATIVE
GLUCOSE, UA: NEGATIVE mg/dL
HGB URINE DIPSTICK: NEGATIVE
Ketones, ur: NEGATIVE mg/dL
Leukocytes, UA: NEGATIVE
Nitrite: NEGATIVE
Protein, ur: NEGATIVE mg/dL
SPECIFIC GRAVITY, URINE: 1.02 (ref 1.005–1.030)
Urobilinogen, UA: 0.2 mg/dL (ref 0.0–1.0)
pH: 7 (ref 5.0–8.0)

## 2015-02-12 LAB — POCT PREGNANCY, URINE: Preg Test, Ur: NEGATIVE

## 2015-02-12 MED ORDER — CEFTRIAXONE SODIUM 250 MG IJ SOLR
INTRAMUSCULAR | Status: AC
  Filled 2015-02-12: qty 250

## 2015-02-12 MED ORDER — AZITHROMYCIN 250 MG PO TABS: ORAL_TABLET | ORAL | Status: DC

## 2015-02-12 MED ORDER — CEFTRIAXONE SODIUM 1 G IJ SOLR
0.5000 g | Freq: Once | INTRAMUSCULAR | Status: AC
  Administered 2015-02-12: 0.5 g via INTRAMUSCULAR

## 2015-02-12 MED ORDER — METRONIDAZOLE 500 MG PO TABS: 500.0000 mg | ORAL_TABLET | Freq: Two times a day (BID) | ORAL | Status: DC

## 2015-02-12 MED ORDER — LIDOCAINE HCL (PF) 1 % IJ SOLN
INTRAMUSCULAR | Status: AC
  Filled 2015-02-12: qty 5

## 2015-02-12 NOTE — ED Notes (Signed)
Patient c/o flu like sx x 8 days. Patient reports she has had diarrhea, vomitting and cold sx, night sweats. Patient is in NAD.

## 2015-02-12 NOTE — Discharge Instructions (Signed)
Nausea and Vomiting Nausea is a sick feeling that often comes before throwing up (vomiting). Vomiting is a reflex where stomach contents come out of your mouth. Vomiting can cause severe loss of body fluids (dehydration). Children and elderly adults can become dehydrated quickly, especially if they also have diarrhea. Nausea and vomiting are symptoms of a condition or disease. It is important to find the cause of your symptoms. CAUSES   Direct irritation of the stomach lining. This irritation can result from increased acid production (gastroesophageal reflux disease), infection, food poisoning, taking certain medicines (such as nonsteroidal anti-inflammatory drugs), alcohol use, or tobacco use.  Signals from the brain.These signals could be caused by a headache, heat exposure, an inner ear disturbance, increased pressure in the brain from injury, infection, a tumor, or a concussion, pain, emotional stimulus, or metabolic problems.  An obstruction in the gastrointestinal tract (bowel obstruction).  Illnesses such as diabetes, hepatitis, gallbladder problems, appendicitis, kidney problems, cancer, sepsis, atypical symptoms of a heart attack, or eating disorders.  Medical treatments such as chemotherapy and radiation.  Receiving medicine that makes you sleep (general anesthetic) during surgery. DIAGNOSIS Your caregiver may ask for tests to be done if the problems do not improve after a few days. Tests may also be done if symptoms are severe or if the reason for the nausea and vomiting is not clear. Tests may include:  Urine tests.  Blood tests.  Stool tests.  Cultures (to look for evidence of infection).  X-rays or other imaging studies. Test results can help your caregiver make decisions about treatment or the need for additional tests. TREATMENT You need to stay well hydrated. Drink frequently but in small amounts.You may wish to drink water, sports drinks, clear broth, or eat frozen  ice pops or gelatin dessert to help stay hydrated.When you eat, eating slowly may help prevent nausea.There are also some antinausea medicines that may help prevent nausea. HOME CARE INSTRUCTIONS   Take all medicine as directed by your caregiver.  If you do not have an appetite, do not force yourself to eat. However, you must continue to drink fluids.  If you have an appetite, eat a normal diet unless your caregiver tells you differently.  Eat a variety of complex carbohydrates (rice, wheat, potatoes, bread), lean meats, yogurt, fruits, and vegetables.  Avoid high-fat foods because they are more difficult to digest.  Drink enough water and fluids to keep your urine clear or pale yellow.  If you are dehydrated, ask your caregiver for specific rehydration instructions. Signs of dehydration may include:  Severe thirst.  Dry lips and mouth.  Dizziness.  Dark urine.  Decreasing urine frequency and amount.  Confusion.  Rapid breathing or pulse. SEEK IMMEDIATE MEDICAL CARE IF:   You have blood or brown flecks (like coffee grounds) in your vomit.  You have black or bloody stools.  You have a severe headache or stiff neck.  You are confused.  You have severe abdominal pain.  You have chest pain or trouble breathing.  You do not urinate at least once every 8 hours.  You develop cold or clammy skin.  You continue to vomit for longer than 24 to 48 hours.  You have a fever. MAKE SURE YOU:   Understand these instructions.  Will watch your condition.  Will get help right away if you are not doing well or get worse. Document Released: 11/06/2005 Document Revised: 01/29/2012 Document Reviewed: 04/05/2011 [employer] Patient Information 2015 [employer]. This information is not intended   to replace advice given to you by your health care provider. Make sure you discuss any questions you have with your health care provider.  Pelvic Inflammatory Disease Pelvic inflammatory  disease (PID) refers to an infection in some or all of the female organs. The infection can be in the uterus, ovaries, fallopian tubes, or the surrounding tissues in the pelvis. PID can cause abdominal or pelvic pain that comes on suddenly (acute pelvic pain). PID is a serious infection because it can lead to lasting (chronic) pelvic pain or the inability to have children (infertile).  CAUSES  The infection is often caused by the normal bacteria found in the vaginal tissues. PID may also be caused by an infection that is spread during sexual contact. PID can also occur following:   The birth of a baby.   A miscarriage.   An abortion.   Major pelvic surgery.   The use of an intrauterine device (IUD).   A sexual assault.  RISK FACTORS Certain factors can put a person at higher risk for PID, such as:  Being younger than 25 years.  Being sexually active at [place] age.  Usingnonbarrier contraception.  Havingmultiple sexual partners.  Having sex with someone who has symptoms of a genital infection.  Using oral contraception. Other times, certain behaviors can increase the possibility of getting PID, such as:  Having sex during your period.  Using a vaginal douche.  Having an intrauterine device (IUD) in place. SYMPTOMS   Abdominal or pelvic pain.   Fever.   Chills.   Abnormal vaginal discharge.  Abnormal uterine bleeding.   Unusual pain shortly after finishing your period. DIAGNOSIS  Your caregiver will choose some of the following methods to make a diagnosis, such as:   Performinga physical exam and history. A pelvic exam typically reveals a very tender uterus and surrounding pelvis.   Ordering laboratory tests including a pregnancy test, blood tests, and urine test.  Orderingcultures of the vagina and cervix to check for a sexually transmitted infection (STI).  Performing an ultrasound.   Performing a laparoscopic procedure to look inside the  pelvis.  TREATMENT   Antibiotic medicines may be prescribed and taken by mouth.   Sexual partners may be treated when the infection is caused by a sexually transmitted disease (STD).   Hospitalization may be needed to give antibiotics intravenously.  Surgery may be needed, but this is rare. It may take weeks until you are completely well. If you are diagnosed with PID, you should also be checked for human immunodeficiency virus (HIV). HOME CARE INSTRUCTIONS   If given, take your antibiotics as directed. Finish the medicine even if you start to feel better.   Only take over-the-counter or prescription medicines for pain, discomfort, or fever as directed by your caregiver.   Do not have sexual intercourse until treatment is completed or as directed by your caregiver. If PID is confirmed, your recent sexual partner(s) will need treatment.   Keep your follow-up appointments. SEEK MEDICAL CARE IF:   You have increased or abnormal vaginal discharge.   You need prescription medicine for your pain.   You vomit.   You cannot take your medicines.   Your partner has an STD.  SEEK IMMEDIATE MEDICAL CARE IF:   You have a fever.   You have increased abdominal or pelvic pain.   You have chills.   You have pain when you urinate.   You are not better after 72 hours following treatment.  MAKE SURE  YOU:   Understand these instructions.  Will watch your condition.  Will get help right away if you are not doing well or get worse. Document Released: 11/06/2005 Document Revised: 03/03/2013 Document Reviewed: 11/02/2011 [facility] Patient Information 2015 [place], [place]. This information is not intended to replace advice given to you by your health care provider. Make sure you discuss any questions you have with your health care provider.  Viral Gastroenteritis Viral gastroenteritis is also known as stomach flu. This condition affects the stomach and intestinal tract. It  can cause sudden diarrhea and vomiting. The illness typically lasts 3 to 8 days. Most people develop an immune response that eventually gets rid of the virus. While this natural response develops, the virus can make you quite ill. CAUSES  Many different viruses can cause gastroenteritis, such as rotavirus or noroviruses. You can catch one of these viruses by consuming contaminated food or water. You may also catch a virus by sharing utensils or other personal items with an infected person or by touching a contaminated surface. SYMPTOMS  The most common symptoms are diarrhea and vomiting. These problems can cause a severe loss of body fluids (dehydration) and a body salt (electrolyte) imbalance. Other symptoms may include:  Fever.  Headache.  Fatigue.  Abdominal pain. DIAGNOSIS  Your caregiver can usually diagnose viral gastroenteritis based on your symptoms and a physical exam. A stool sample may also be taken to test for the presence of viruses or other infections. TREATMENT  This illness typically goes away on its own. Treatments are aimed at rehydration. The most serious cases of viral gastroenteritis involve vomiting so severely that you are not able to keep fluids down. In these cases, fluids must be given through an intravenous line (IV). HOME CARE INSTRUCTIONS   Drink enough fluids to keep your urine clear or pale yellow. Drink small amounts of fluids frequently and increase the amounts as tolerated.  Ask your caregiver for specific rehydration instructions.  Avoid:  Foods high in sugar.  Alcohol.  Carbonated drinks.  Tobacco.  Juice.  Caffeine drinks.  Extremely hot or cold fluids.  Fatty, greasy foods.  Too much intake of anything at one time.  Dairy products until 24 to 48 hours after diarrhea stops.  You may consume probiotics. Probiotics are active cultures of beneficial bacteria. They may lessen the amount and number of diarrheal stools in adults. Probiotics  can be found in yogurt with active cultures and in supplements.  Wash your hands well to avoid spreading the virus.  Only take over-the-counter or prescription medicines for pain, discomfort, or fever as directed by your caregiver. Do not give aspirin to children. Antidiarrheal medicines are not recommended.  Ask your caregiver if you should continue to take your regular prescribed and over-the-counter medicines.  Keep all follow-up appointments as directed by your caregiver. SEEK IMMEDIATE MEDICAL CARE IF:   You are unable to keep fluids down.  You do not urinate at least once every 6 to 8 hours.  You develop shortness of breath.  You notice blood in your stool or vomit. This may look like coffee grounds.  You have abdominal pain that increases or is concentrated in one small area (localized).  You have persistent vomiting or diarrhea.  You have a fever.  The patient is a child younger than 3 months, and he or she has a fever.  The patient is a child older than 3 months, and he or she has a fever and persistent symptoms.  The  patient is a child older than 3 months, and he or she has a fever and symptoms suddenly get worse.  The patient is a baby, and he or she has no tears when crying. MAKE SURE YOU:   Understand these instructions.  Will watch your condition.  Will get help right away if you are not doing well or get worse. Document Released: 11/06/2005 Document Revised: 01/29/2012 Document Reviewed: 08/23/2011 [facility] Patient Information 2015 [place], [place]. This information is not intended to replace advice given to you by your health care provider. Make sure you discuss any questions you have with your health care provider.  Diarrhea Diarrhea is frequent loose and watery bowel movements. It can cause you to feel weak and dehydrated. Dehydration can cause you to become tired and thirsty, have a dry mouth, and have decreased urination that often is dark yellow.  Diarrhea is a sign of another problem, most often an infection that will not last long. In most cases, diarrhea typically lasts 2-3 days. However, it can last longer if it is a sign of something more serious. It is important to treat your diarrhea as directed by your caregiver to lessen or prevent future episodes of diarrhea. CAUSES  Some common causes include:  Gastrointestinal infections caused by viruses, bacteria, or parasites.  Food poisoning or food allergies.  Certain medicines, such as antibiotics, chemotherapy, and laxatives.  Artificial sweeteners and fructose.  Digestive disorders. HOME CARE INSTRUCTIONS  Ensure adequate fluid intake (hydration): Have 1 cup (8 oz) of fluid for each diarrhea episode. Avoid fluids that contain simple sugars or sports drinks, fruit juices, whole milk products, and sodas. Your urine should be clear or pale yellow if you are drinking enough fluids. Hydrate with an oral rehydration solution that you can purchase at pharmacies, retail stores, and online. You can prepare an oral rehydration solution at home by mixing the following ingredients together:   - tsp table salt.   tsp baking soda.   tsp salt substitute containing potassium chloride.  1  tablespoons sugar.  1 L (34 oz) of water.  Certain foods and beverages may increase the speed at which food moves through the gastrointestinal (GI) tract. These foods and beverages should be avoided and include:  Caffeinated and alcoholic beverages.  High-fiber foods, such as raw fruits and vegetables, nuts, seeds, and whole grain breads and cereals.  Foods and beverages sweetened with sugar alcohols, such as xylitol, sorbitol, and mannitol.  Some foods may be well tolerated and may help thicken stool including:  Starchy foods, such as rice, toast, pasta, low-sugar cereal, oatmeal, grits, baked potatoes, crackers, and bagels.  Bananas.  Applesauce.  Add probiotic-rich foods to help increase  healthy bacteria in the GI tract, such as yogurt and fermented milk products.  Wash your hands well after each diarrhea episode.  Only take over-the-counter or prescription medicines as directed by your caregiver.  Take a warm bath to relieve any burning or pain from frequent diarrhea episodes. SEEK IMMEDIATE MEDICAL CARE IF:   You are unable to keep fluids down.  You have persistent vomiting.  You have blood in your stool, or your stools are black and tarry.  You do not urinate in 6-8 hours, or there is only a small amount of very dark urine.  You have abdominal pain that increases or localizes.  You have weakness, dizziness, confusion, or light-headedness.  You have a severe headache.  Your diarrhea gets worse or does not get better.  You have a  fever or persistent symptoms for more than 2-3 days.  You have a fever and your symptoms suddenly get worse. MAKE SURE YOU:   Understand these instructions.  Will watch your condition.  Will get help right away if you are not doing well or get worse. Document Released: 10/27/2002 Document Revised: 03/23/2014 Document Reviewed: 07/14/2012 [facility] Patient Information 2015 [place], [place]. This information is not intended to replace advice given to you by your health care provider. Make sure you discuss any questions you have with your health care provider.

## 2015-02-12 NOTE — ED Provider Notes (Signed)
CSN: [id number]     Arrival date & time 02/12/15  1401 History   First MD Initiated Contact with Patient 02/12/15 1501     Chief Complaint  Patient presents with  . Influenza   (Consider location/radiation/quality/duration/timing/severity/associated sxs/prior Treatment) HPI Comments: 29 year old female complaining of vomiting and diarrhea almost days in the past 8 days. She initially went to an urgent care and was told that she had the flu and was given a prescription for Phenergan. She is continued to have chills, lower abdominal cramping, feeling cold and then sweating and having subjective fever. She last vomited once this morning. Her stools are watery but not bloody. She denies urinary symptoms. The Phenergan has helped some with her nausea and vomiting.    Past Medical History  Diagnosis Date  . TMJ (dislocation of temporomandibular joint)    Past Surgical History  Procedure Laterality Date  . Tubal ligation     No family history on file. History  Substance Use Topics  . Smoking status: Current Every Day Smoker -- 0.50 packs/day  . Smokeless tobacco: Not on file  . Alcohol Use: Yes   OB History    No data available     Review of Systems  Constitutional: Positive for fever, chills, activity change and appetite change.  HENT: Negative for congestion, ear discharge and postnasal drip.   Respiratory: Positive for cough. Negative for chest tightness, shortness of breath and wheezing.   Cardiovascular: Negative for chest pain, palpitations and leg swelling.  Gastrointestinal: Positive for nausea, vomiting, abdominal pain and diarrhea. Negative for constipation and blood in stool.  Genitourinary: Positive for frequency and dyspareunia. Negative for dysuria, hematuria, vaginal discharge and menstrual problem.  Musculoskeletal: Negative.     Allergies  Review of patient's allergies indicates no known allergies.  Home Medications   Prior to Admission medications    Medication Sig Start Date End Date Taking? Authorizing Provider  antipyrine-benzocaine Lyla Son) otic solution Place 3-4 drops into the right ear every 2 (two) hours as needed for ear pain. 06/12/14   Rodolph Bong, MD  azithromycin (ZITHROMAX) 250 MG tablet Take all 4 tabs po now 02/12/15   Hayden Rasmussen, NP  busPIRone (BUSPAR) 10 MG tablet Take 10 mg by mouth 2 (two) times daily.    Historical Provider, MD  chlorpheniramine-HYDROcodone (TUSSIONEX PENNKINETIC ER) 10-8 MG/5ML LQCR Take 5 mLs by mouth 2 (two) times daily. 03/12/14   Junious Silk, PA-C  ciprofloxacin-dexamethasone (CIPRODEX) otic suspension Place 4 drops into the right ear 2 (two) times daily. 06/12/14   Rodolph Bong, MD  DULoxetine (CYMBALTA) 30 MG capsule Take 30 mg by mouth daily.    Historical Provider, MD  metroNIDAZOLE (FLAGYL) 500 MG tablet Take 1 tablet (500 mg total) by mouth 2 (two) times daily. X 7 days 02/12/15   Hayden Rasmussen, NP   BP 115/80 mmHg  Pulse 69  Temp(Src) 98.2 F (36.8 C) (Oral)  Resp 18  SpO2 97%  LMP 01/20/2015 Physical Exam  Constitutional: She is oriented to person, place, and time. She appears well-developed and well-nourished. No distress.  HENT:  Mouth/Throat: Oropharynx is clear and moist. No oropharyngeal exudate.  Bilat TM's nl  Eyes: Conjunctivae and EOM are normal. Pupils are equal, round, and reactive to light.  Neck: Normal range of motion. Neck supple.  Cardiovascular: Normal rate, regular rhythm, normal heart sounds and intact distal pulses.   Pulmonary/Chest: Effort normal and breath sounds normal. No respiratory distress. She has no wheezes. She has no  rales.  Abdominal: Soft. Bowel sounds are normal. She exhibits no distension and no mass. There is tenderness. There is no rebound and no guarding.  Mild tenderness across the mid abdomen. Tenderness across the suprapubic, greatest in the midline.  Genitourinary:  Normal external female genitalia. There is generalized vaginal  tenderness. There is a mucoid thick fluid that is partially gray, clear and bloody in the vaginal vault and covering the cervix. This was removed. Endocervix is pink in the central portions but the periphery is erythematous. Bimanual: Positive CMT and severe adnexal tenderness.  Musculoskeletal: She exhibits no edema.  Lymphadenopathy:    She has no cervical adenopathy.  Neurological: She is alert and oriented to person, place, and time.  Skin: Skin is warm and dry.  Psychiatric: She has a normal mood and affect.  Nursing note and vitals reviewed.   ED Course  Procedures (including critical care time) Labs Review Labs Reviewed  POCT URINALYSIS DIP (DEVICE)  POCT PREGNANCY, URINE  CERVICOVAGINAL ANCILLARY ONLY   Results for orders placed or performed during the hospital encounter of 02/12/15  POCT urinalysis dip (device)  Result Value Ref Range   Glucose, UA NEGATIVE NEGATIVE mg/dL   Bilirubin Urine NEGATIVE NEGATIVE   Ketones, ur NEGATIVE NEGATIVE mg/dL   Specific Gravity, Urine 1.020 1.005 - 1.030   Hgb urine dipstick NEGATIVE NEGATIVE   pH 7.0 5.0 - 8.0   Protein, ur NEGATIVE NEGATIVE mg/dL   Urobilinogen, UA 0.2 0.0 - 1.0 mg/dL   Nitrite NEGATIVE NEGATIVE   Leukocytes, UA NEGATIVE NEGATIVE  Pregnancy, urine POC  Result Value Ref Range   Preg Test, Ur NEGATIVE NEGATIVE    Imaging Review No results found.   MDM   1. Gastroenteritis   2. PID (acute pelvic inflammatory disease)   3. Diarrhea    Rocephin 500 mg IM Rx for azithromycin 1 g by mouth, Flagyl 500 mg twice a day Cervical cytology is pending May continue using your Phenergan as needed for nausea or vomiting Use Imodium A-D if having more than 3 watery stools and 6 hours. Follow-up here PCP as needed. For worsening symptoms or problems may return or go to emergency department.    Hayden Rasmussenavid Deagan Sevin, NP 02/12/15 [phone]

## 2015-02-13 ENCOUNTER — Inpatient Hospital Stay (HOSPITAL_COMMUNITY)
Admission: AD | Admit: 2015-02-13 | Discharge: 2015-02-13 | Disposition: A | Discharge status: Home / Self Care | Payer: Medicaid Other | Source: Ambulatory Visit | Attending: Obstetrics & Gynecology | Admitting: Obstetrics & Gynecology

## 2015-02-13 ENCOUNTER — Inpatient Hospital Stay (HOSPITAL_COMMUNITY): Payer: Medicaid Other

## 2015-02-13 ENCOUNTER — Encounter (HOSPITAL_COMMUNITY): Payer: Self-pay | Admitting: *Deleted

## 2015-02-13 DIAGNOSIS — Z9851 Tubal ligation status: Secondary | ICD-10-CM

## 2015-02-13 DIAGNOSIS — R102 Pelvic and perineal pain: Secondary | ICD-10-CM | POA: Insufficient documentation

## 2015-02-13 DIAGNOSIS — N739 Female pelvic inflammatory disease, unspecified: Secondary | ICD-10-CM | POA: Insufficient documentation

## 2015-02-13 DIAGNOSIS — N939 Abnormal uterine and vaginal bleeding, unspecified: Secondary | ICD-10-CM

## 2015-02-13 DIAGNOSIS — F17210 Nicotine dependence, cigarettes, uncomplicated: Secondary | ICD-10-CM | POA: Insufficient documentation

## 2015-02-13 LAB — WET PREP, GENITAL
CLUE CELLS WET PREP: NONE SEEN
TRICH WET PREP: NONE SEEN
YEAST WET PREP: NONE SEEN

## 2015-02-13 LAB — CBC WITH DIFFERENTIAL/PLATELET
BASOS ABS: 0 10*3/uL (ref 0.0–0.1)
Basophils Relative: 0 % (ref 0–1)
EOS ABS: 0.1 10*3/uL (ref 0.0–0.7)
Eosinophils Relative: 1 % (ref 0–5)
HCT: 43.5 % (ref 36.0–46.0)
HEMOGLOBIN: 14.8 g/dL (ref 12.0–15.0)
Lymphocytes Relative: 27 % (ref 12–46)
Lymphs Abs: 3 10*3/uL (ref 0.7–4.0)
MCH: 29.8 pg (ref 26.0–34.0)
MCHC: 34 g/dL (ref 30.0–36.0)
MCV: 87.5 fL (ref 78.0–100.0)
Monocytes Absolute: 0.8 10*3/uL (ref 0.1–1.0)
Monocytes Relative: 7 % (ref 3–12)
NEUTROS ABS: 7.5 10*3/uL (ref 1.7–7.7)
Neutrophils Relative %: 65 % (ref 43–77)
PLATELETS: 330 10*3/uL (ref 150–400)
RBC: 4.97 MIL/uL (ref 3.87–5.11)
RDW: 13.6 % (ref 11.5–15.5)
WBC: 11.2 10*3/uL — ABNORMAL HIGH (ref 4.0–10.5)

## 2015-02-13 IMAGING — US US PELVIS COMPLETE
1 series · 14 of 25 positions shown · non-contrast
Comparison: None

CLINICAL DATA: Patient with heavy bleeding and cramping since
[DATE].



[Series 1: us pelvis complete · 0.20mm/px · 14 of 58 slices shown]
[im 1/58]
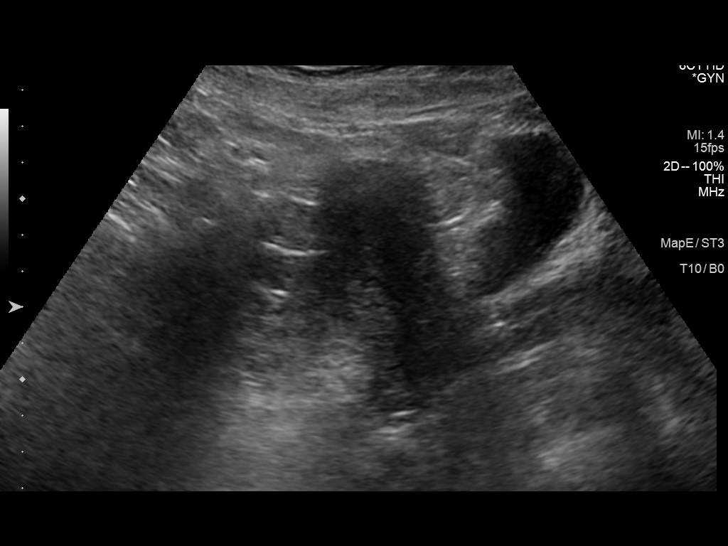
[im 5/58]
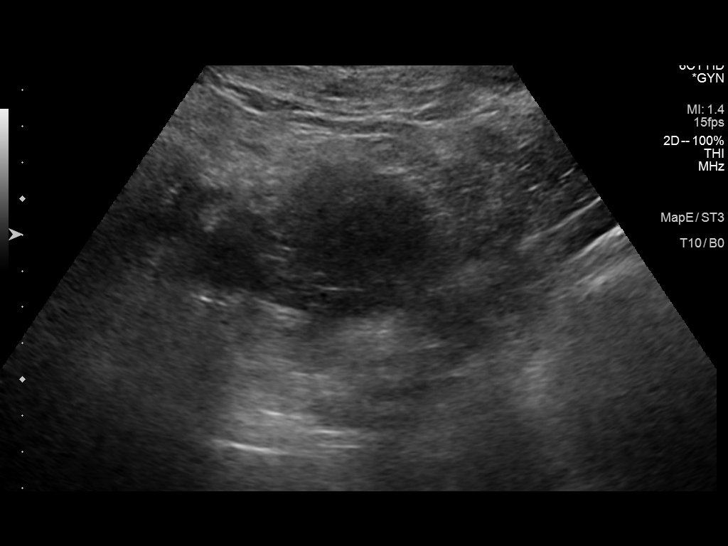
[im 10/58]
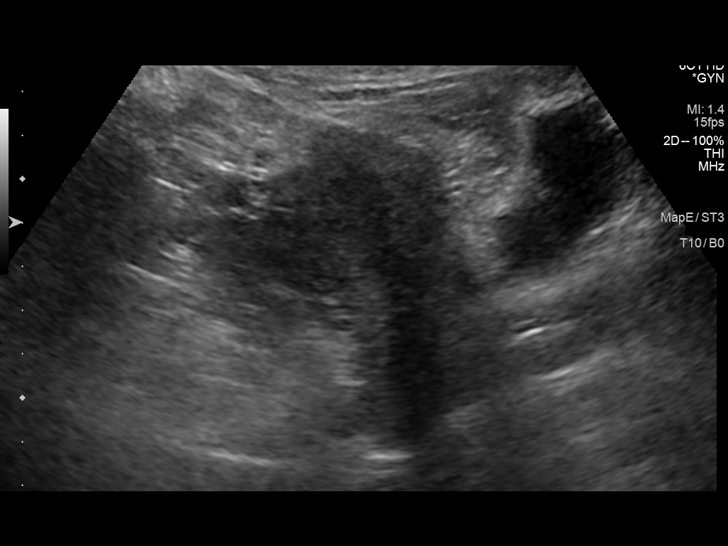
[im 15/58]
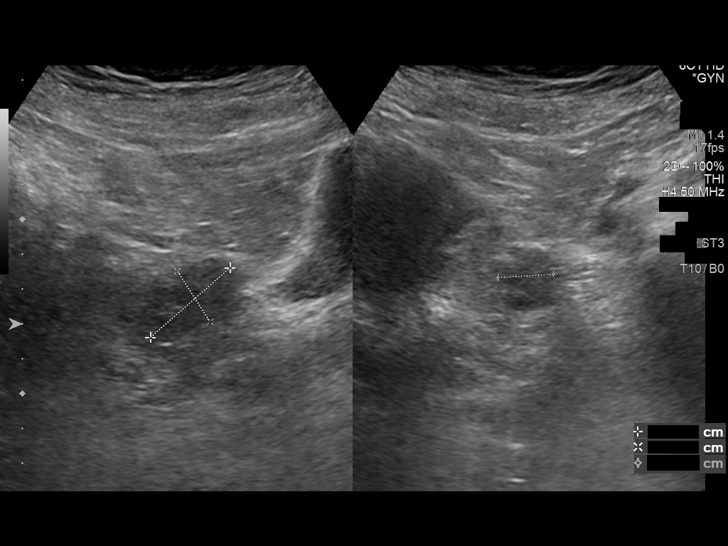
[im 20/58]
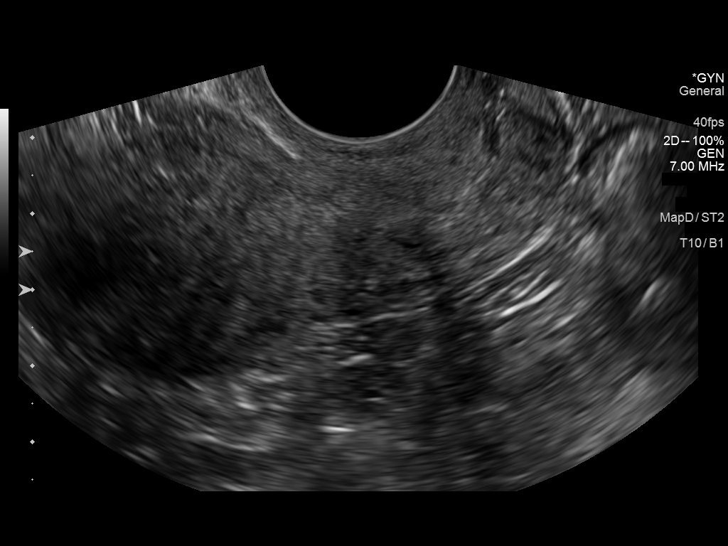
[im 22/58]
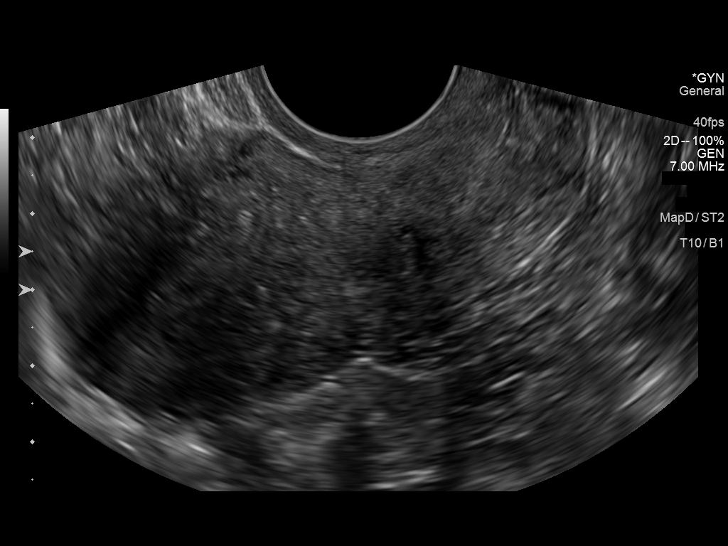
[im 27/58]
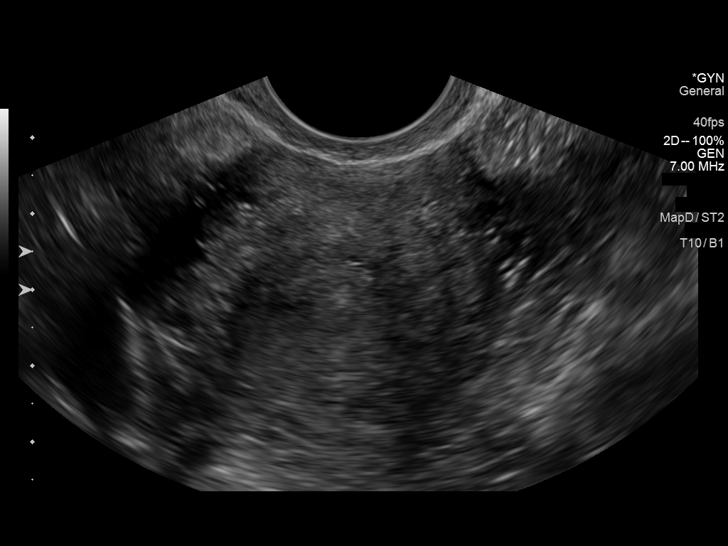
[im 31/58]
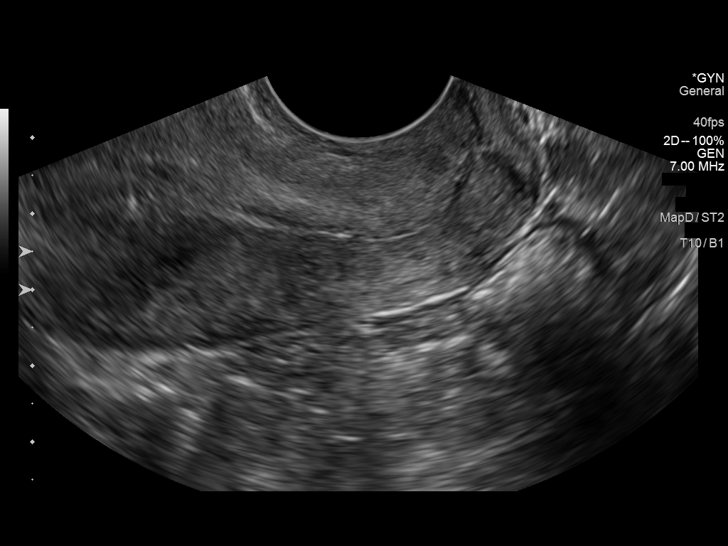
[im 36/58]
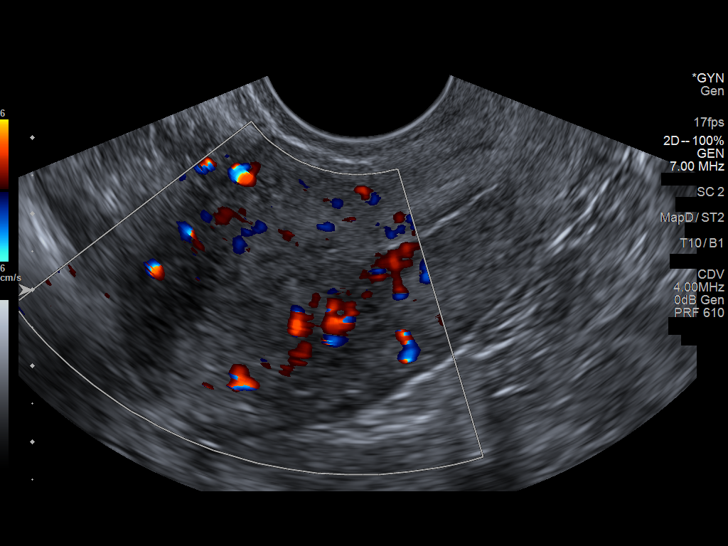
[im 39/58]
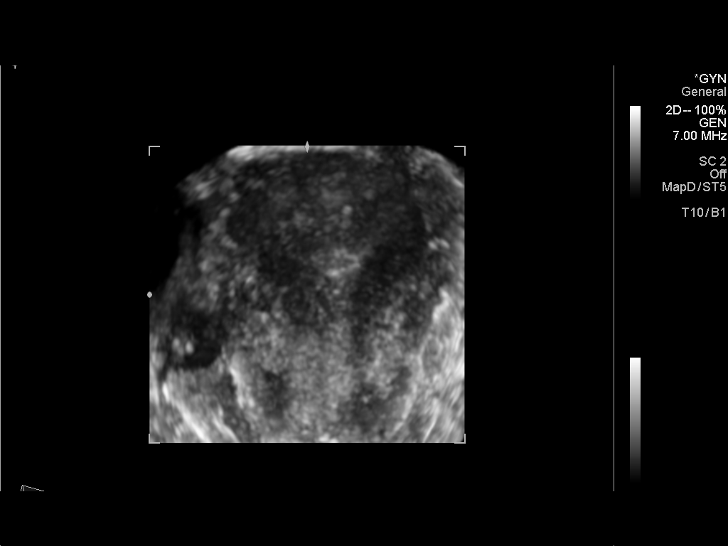
[im 43/58]
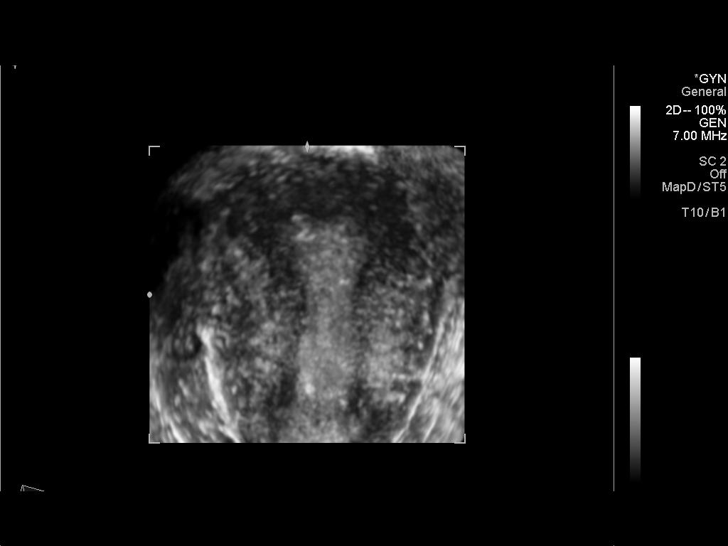
[im 48/58]
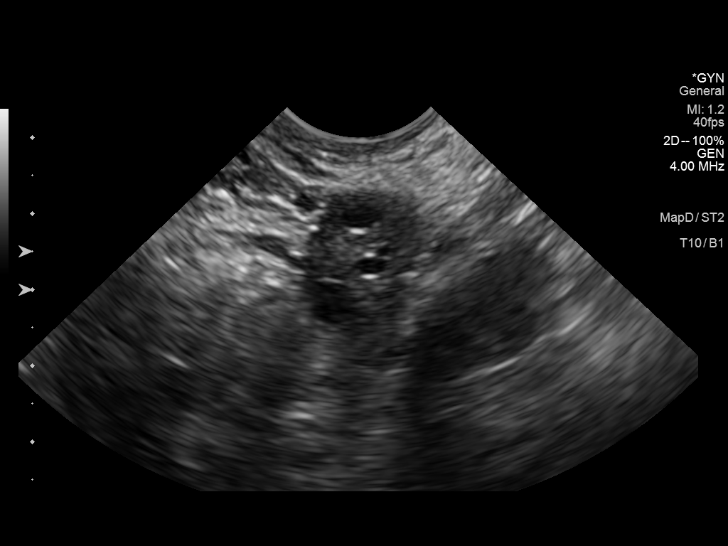
[im 53/58]
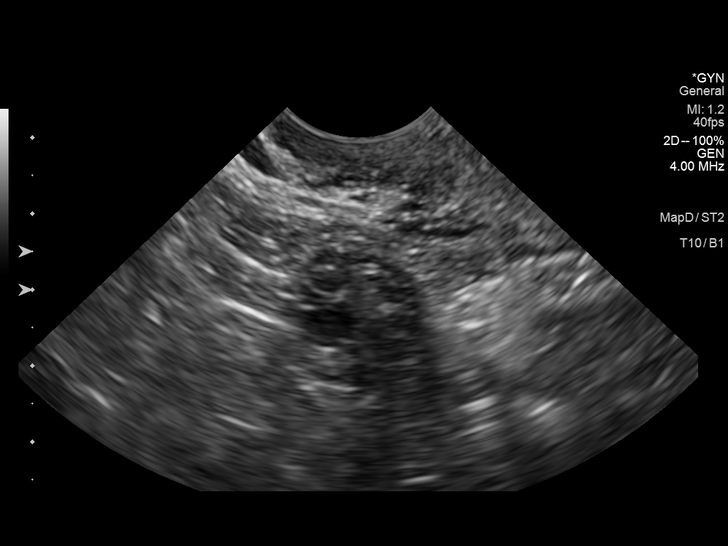
[im 58/58]
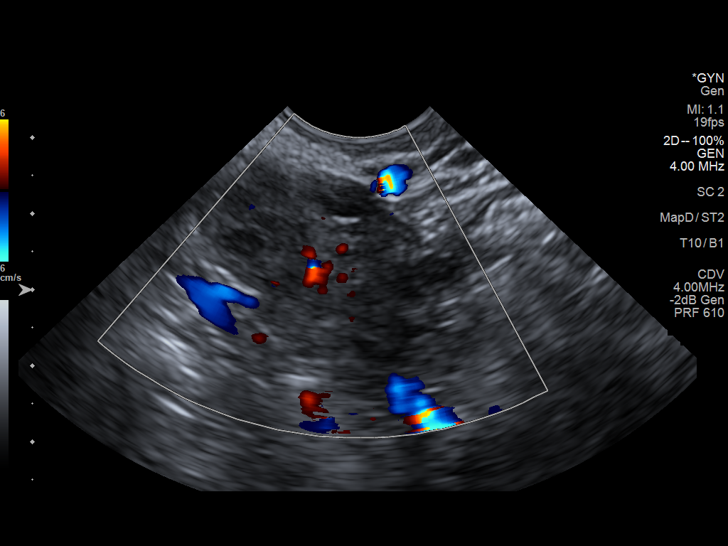

[14 of 25 positions shown; findings below may reference images not displayed]

FINDINGS: Uterus

Measurements: 7.2 x 3.7 x 4.2 cm. No fibroids or other mass
visualized.

Endometrium

Thickness: 3 mm.  No focal abnormality visualized.

Right ovary

Measurements: 3.2 x 1.6 x 2.1 cm. Normal ppearance/no adnexal mass.

Left ovary

Measurements: 4.1 x 1.7 x 1.5 cm. Normal appearance/no adnexal mass.

Other findings

No free fluid.
IMPRESSION: Endometrium measures 3 mm. If bleeding remains unresponsive to
hormonal or medical therapy, sonohysterogram should be considered
for focal lesion work-up. (Ref: Radiological Reasoning: Algorithmic
Workup of Abnormal Vaginal Bleeding with Endovaginal Sonography and
Sonohysterography. AJR [6Z]; 191:S68-73)

## 2015-02-13 MED ORDER — KETOROLAC TROMETHAMINE 60 MG/2ML IM SOLN
60.0000 mg | Freq: Once | INTRAMUSCULAR | Status: AC
  Administered 2015-02-13: 60 mg via INTRAMUSCULAR
  Filled 2015-02-13: qty 2

## 2015-02-13 NOTE — Discharge Instructions (Signed)
Abdominal Pain, Women °Abdominal (stomach, pelvic, or belly) pain can be caused by many things. It is important to tell your doctor: °· The location of the pain. °· Does it come and go or is it present all the time? °· Are there things that start the pain (eating certain foods, exercise)? °· Are there other symptoms associated with the pain (fever, nausea, vomiting, diarrhea)? °All of this is helpful to know when trying to find the cause of the pain. °CAUSES  °· Stomach: virus or bacteria infection, or ulcer. °· Intestine: appendicitis (inflamed appendix), regional ileitis (Crohn's disease), ulcerative colitis (inflamed colon), irritable bowel syndrome, diverticulitis (inflamed diverticulum of the colon), or cancer of the stomach or intestine. °· Gallbladder disease or stones in the gallbladder. °· Kidney disease, kidney stones, or infection. °· Pancreas infection or cancer. °· Fibromyalgia (pain disorder). °· Diseases of the female organs: °· Uterus: fibroid (non-cancerous) tumors or infection. °· Fallopian tubes: infection or tubal pregnancy. °· Ovary: cysts or tumors. °· Pelvic adhesions (scar tissue). °· Endometriosis (uterus lining tissue growing in the pelvis and on the pelvic organs). °· Pelvic congestion syndrome (female organs filling up with blood just before the menstrual period). °· Pain with the menstrual period. °· Pain with ovulation (producing an egg). °· Pain with an IUD (intrauterine device, birth control) in the uterus. °· Cancer of the female organs. °· Functional pain (pain not caused by a disease, may improve without treatment). °· Psychological pain. °· Depression. °DIAGNOSIS  °Your doctor will decide the seriousness of your pain by doing an examination. °· Blood tests. °· X-rays. °· Ultrasound. °· CT scan (computed tomography, special type of X-ray). °· MRI (magnetic resonance imaging). °· Cultures, for infection. °· Barium enema (dye inserted in the large intestine, to better view it with  X-rays). °· Colonoscopy (looking in intestine with a lighted tube). °· Laparoscopy (minor surgery, looking in abdomen with a lighted tube). °· Major abdominal exploratory surgery (looking in abdomen with a large incision). °TREATMENT  °The treatment will depend on the cause of the pain.  °· Many cases can be observed and treated at home. °· Over-the-counter medicines recommended by your caregiver. °· Prescription medicine. °· Antibiotics, for infection. °· Birth control pills, for painful periods or for ovulation pain. °· Hormone treatment, for endometriosis. °· Nerve blocking injections. °· Physical therapy. °· Antidepressants. °· Counseling with a psychologist or psychiatrist. °· Minor or major surgery. °HOME CARE INSTRUCTIONS  °· Do not take laxatives, unless directed by your caregiver. °· Take over-the-counter pain medicine only if ordered by your caregiver. Do not take aspirin because it can cause an upset stomach or bleeding. °· Try a clear liquid diet (broth or water) as ordered by your caregiver. Slowly move to a bland diet, as tolerated, if the pain is related to the stomach or intestine. °· Have a thermometer and take your temperature several times a day, and record it. °· Bed rest and sleep, if it helps the pain. °· Avoid sexual intercourse, if it causes pain. °· Avoid stressful situations. °· Keep your follow-up appointments and tests, as your caregiver orders. °· If the pain does not go away with medicine or surgery, you may try: °· Acupuncture. °· Relaxation exercises (yoga, meditation). °· Group therapy. °· Counseling. °SEEK MEDICAL CARE IF:  °· You notice certain foods cause stomach pain. °· Your home care treatment is not helping your pain. °· You need stronger pain medicine. °· You want your IUD removed. °· You feel faint or   lightheaded. °· You develop nausea and vomiting. °· You develop a rash. °· You are having side effects or an allergy to your medicine. °SEEK IMMEDIATE MEDICAL CARE IF:  °· Your  pain does not go away or gets worse. °· You have a fever. °· Your pain is felt only in portions of the abdomen. The right side could possibly be appendicitis. The left lower portion of the abdomen could be colitis or diverticulitis. °· You are passing blood in your stools (bright red or black tarry stools, with or without vomiting). °· You have blood in your urine. °· You develop chills, with or without a fever. °· You pass out. °MAKE SURE YOU:  °· Understand these instructions. °· Will watch your condition. °· Will get help right away if you are not doing well or get worse. °Document Released: 09/03/2007 Document Revised: 03/23/2014 Document Reviewed: 09/23/2009 °ExitCare® Patient Information ©2015 [employer]. This information is not intended to replace advice given to you by your health care provider. Make sure you discuss any questions you have with your health care provider. ° ° ° °Abnormal Uterine Bleeding °Abnormal uterine bleeding can affect women at various stages in life, including teenagers, women in their reproductive years, pregnant women, and women who have reached menopause. Several kinds of uterine bleeding are considered abnormal, including: °· Bleeding or spotting between periods.   °· Bleeding after sexual intercourse.   °· Bleeding that is heavier or more than normal.   °· Periods that last longer than usual. °· Bleeding after menopause.   °Many cases of abnormal uterine bleeding are minor and simple to treat, while others are more serious. Any type of abnormal bleeding should be evaluated by your health care provider. Treatment will depend on the cause of the bleeding. °HOME CARE INSTRUCTIONS °Monitor your condition for any changes. The following actions may help to alleviate any discomfort you are experiencing: °· Avoid the use of tampons and douches as directed by your health care provider. °· Change your pads frequently. °You should get regular pelvic exams and Pap tests. Keep all follow-up  appointments for diagnostic tests as directed by your health care provider.  °SEEK MEDICAL CARE IF:  °· Your bleeding lasts more than 1 week.   °· You feel dizzy at times.   °SEEK IMMEDIATE MEDICAL CARE IF:  °· You pass out.   °· You are changing pads every 15 to 30 minutes.   °· You have abdominal pain. °· You have a fever.   °· You become sweaty or weak.   °· You are passing large blood clots from the vagina.   °· You start to feel nauseous and vomit. °MAKE SURE YOU:  °· Understand these instructions. °· Will watch your condition. °· Will get help right away if you are not doing well or get worse. °Document Released: 11/06/2005 Document Revised: 11/11/2013 Document Reviewed: 06/05/2013 °Exit[employer]® Patient Information ©2015 [employer]. This information is not intended to replace advice given to you by your health care provider. Make sure you discuss any questions you have with your health care provider. ° °

## 2015-02-13 NOTE — MAU Provider Note (Signed)
History     CSN: [id number]  Arrival date and time: 02/13/15 1403   First Provider Initiated Contact with Patient 02/13/15 1440      Chief Complaint  Patient presents with  . Pelvic Inflammatory Disease   HPI Maria Deleon is 29 y.o. [id number] presents for evaluation of heavy bleeding without clots, that began last night.  States she was [facility] for persistent ? Flu sxs of diarrhea,chills, sweating, N&VX 2 yesterday.  She has had these sxs off and on for almost 2 weeks.  She reports flu like sxs are less today  At Urgent Care she was Dx With PID, treated with IM and Oral meds.  She reports LMP 3/4 X 4 days thought this cycle may have early.  Now bleeding again.  Cramping in lower abdomen, worse than normal menstrual cramping.  Greater on left.  Rates 10/10 at this time 7/10.  Has not taken any med for pain.  1 sexual partner X 5 yrs, BTL for contraception.     Past Medical History  Diagnosis Date  . TMJ (dislocation of temporomandibular joint)     Past Surgical History  Procedure Laterality Date  . Tubal ligation      History reviewed. No pertinent family history.  History  Substance Use Topics  . Smoking status: Current Every Day Smoker -- 0.50 packs/day  . Smokeless tobacco: Not on file  . Alcohol Use: Yes    Allergies: No Known Allergies  Prescriptions prior to admission  Medication Sig Dispense Refill Last Dose  . metroNIDAZOLE (FLAGYL) 500 MG tablet Take 1 tablet (500 mg total) by mouth 2 (two) times daily. X 7 days 14 tablet 0 02/12/2015 at Unknown time  . antipyrine-benzocaine (AURALGAN) otic solution Place 3-4 drops into the right ear every 2 (two) hours as needed for ear pain. (Patient not taking: Reported on 02/13/2015) 10 mL 0   . azithromycin (ZITHROMAX) 250 MG tablet Take all 4 tabs po now (Patient not taking: Reported on 02/13/2015) 4 each 0   . chlorpheniramine-HYDROcodone (TUSSIONEX PENNKINETIC ER) 10-8 MG/5ML LQCR Take 5 mLs by mouth 2 (two)  times daily. (Patient not taking: Reported on 02/13/2015) 115 mL 0   . ciprofloxacin-dexamethasone (CIPRODEX) otic suspension Place 4 drops into the right ear 2 (two) times daily. (Patient not taking: Reported on 02/13/2015) 7.5 mL 0     Review of Systems  Constitutional: Negative for fever and chills.  Respiratory: Negative for cough and shortness of breath.   Cardiovascular: Negative for chest pain.  Gastrointestinal: Positive for nausea (yesterday) and vomiting (yesterday). Abdominal pain:  for bilateral lower abdominal pain left greater than right. Diarrhea: resolving   Genitourinary: Negative for dysuria, urgency and frequency.       + for vaginal bleeding.  Neurological: Negative for headaches.   Physical Exam   Blood pressure 117/75, pulse 76, temperature 97.7 F (36.5 C), resp. rate 18, last menstrual period 01/20/2015.  Physical Exam  Vitals reviewed. Constitutional: She is oriented to person, place, and time. She appears well-developed and well-nourished. No distress.  HENT:  Head: Normocephalic.  Neck: Normal range of motion.  Cardiovascular: Normal rate.   Respiratory: Effort normal.  GI: Soft. She exhibits no distension and no mass. There is tenderness (bilaterally moderate tenderness with more discomfort on the left. ). There is no rebound and no guarding.  Genitourinary: There is no rash, tenderness or lesion on the right labia. There is no rash, tenderness or lesion on the left labia.  Uterus is tender (mildly tender). Uterus is not enlarged. Cervix exhibits no motion tenderness and no discharge. Right adnexum displays tenderness. Right adnexum displays no mass and no fullness. Left adnexum displays tenderness. Left adnexum displays no mass and no fullness. There is bleeding (moderate bleeding, dark red without clots) in the vagina. No tenderness in the vagina. No vaginal discharge found.  Neurological: She is alert and oriented to person, place, and time.  Skin: Skin is  warm and dry.  Psychiatric: She has a normal mood and affect. Her behavior is normal. Judgment and thought content normal.   Results for orders placed or performed during the hospital encounter of 02/13/15 (from the past 24 hour(s))  Wet prep, genital     Status: Abnormal   Collection Time: 02/13/15  2:20 PM  Result Value Ref Range   Yeast Wet Prep HPF POC NONE SEEN NONE SEEN   Trich, Wet Prep NONE SEEN NONE SEEN   Clue Cells Wet Prep HPF POC NONE SEEN NONE SEEN   WBC, Wet Prep HPF POC FEW (A) NONE SEEN  CBC with Differential/Platelet     Status: Abnormal   Collection Time: 02/13/15  3:05 PM  Result Value Ref Range   WBC 11.2 (H) 4.0 - 10.5 K/uL   RBC 4.97 3.87 - 5.11 MIL/uL   Hemoglobin 14.8 12.0 - 15.0 g/dL   HCT 16.1 09.6 - 04.5 %   MCV 87.5 78.0 - 100.0 fL   MCH 29.8 26.0 - 34.0 pg   MCHC 34.0 30.0 - 36.0 g/dL   RDW 40.9 81.1 - 91.4 %   Platelets 330 150 - 400 K/uL   Neutrophils Relative % 65 43 - 77 %   Neutro Abs 7.5 1.7 - 7.7 K/uL   Lymphocytes Relative 27 12 - 46 %   Lymphs Abs 3.0 0.7 - 4.0 K/uL   Monocytes Relative 7 3 - 12 %   Monocytes Absolute 0.8 0.1 - 1.0 K/uL   Eosinophils Relative 1 0 - 5 %   Eosinophils Absolute 0.1 0.0 - 0.7 K/uL   Basophils Relative 0 0 - 1 %   Basophils Absolute 0.0 0.0 - 0.1 K/uL       CLINICAL DATA: Patient with heavy bleeding and cramping since 03/25.  EXAM: TRANSABDOMINAL AND TRANSVAGINAL ULTRASOUND OF PELVIS  TECHNIQUE: Both transabdominal and transvaginal ultrasound examinations of the pelvis were performed. Transabdominal technique was performed for global imaging of the pelvis including uterus, ovaries, adnexal regions, and pelvic cul-de-sac. It was necessary to proceed with endovaginal exam following the transabdominal exam to visualize the endometrium.  COMPARISON: None  FINDINGS: Uterus  Measurements: 7.2 x 3.7 x 4.2 cm. No fibroids or other mass visualized.  Endometrium  Thickness: 3 mm. No focal  abnormality visualized.  Right ovary  Measurements: 3.2 x 1.6 x 2.1 cm. Normal ppearance/no adnexal mass.  Left ovary  Measurements: 4.1 x 1.7 x 1.5 cm. Normal appearance/no adnexal mass.  Other findings  No free fluid.  IMPRESSION: Endometrium measures 3 mm. If bleeding remains unresponsive to hormonal or medical therapy, sonohysterogram should be considered for focal lesion work-up. (Ref: Radiological Reasoning: Algorithmic Workup of Abnormal Vaginal Bleeding with Endovaginal Sonography and Sonohysterography. AJR 2008; 782:N56-21)   Electronically Signed  By: Annia Belt M.D.    MAU Course  Procedures  MDM Patient reported she continues to be uncomfortable after U/S.  Felt pain in her lower back during procedure.  Toradol  IM given.  Patient has not had NSAID since bleeding/pain  began.   Assessment and Plan  A:  Vaginal bleeding      Pelvic Pain       Normal U/S      Hx of Bilateral Tubal Ligation      Negative UPT at [facility] yesterday      DX PID at [facility] yesterday  P:  Continue antibiotics prescribed for PID       Encouraged her to find GYN if sxs continue--keep menstrual calendar       List of area MDs given to patient       May take IBUPROFEN tomorrow if pain returns.      Cultures will be reported if Positive   Tatyana Biber,EVE M 02/13/2015, 4:27 PM

## 2015-02-13 NOTE — MAU Note (Signed)
Pt presents to MAU with complaints of pain in her lower abdomen and heavy vaginal bleeding. Reports she is being treated for PID, was evaluated at urgent care yesterday.

## 2015-02-14 LAB — HIV ANTIBODY (ROUTINE TESTING W REFLEX): HIV SCREEN 4TH GENERATION: NONREACTIVE

## 2015-02-15 LAB — CERVICOVAGINAL ANCILLARY ONLY: WET PREP (BD AFFIRM): NEGATIVE

## 2015-02-16 LAB — CERVICOVAGINAL ANCILLARY ONLY
CHLAMYDIA, DNA PROBE: NEGATIVE
NEISSERIA GONORRHEA: NEGATIVE

## 2016-08-09 ENCOUNTER — Ambulatory Visit (HOSPITAL_COMMUNITY)
Admission: EM | Admit: 2016-08-09 | Discharge: 2016-08-09 | Disposition: A | Discharge status: Home / Self Care | Payer: Medicaid Other | Attending: Family Medicine | Admitting: Family Medicine

## 2016-08-09 ENCOUNTER — Encounter (HOSPITAL_COMMUNITY): Payer: Self-pay | Admitting: *Deleted

## 2016-08-09 DIAGNOSIS — M62830 Muscle spasm of back: Secondary | ICD-10-CM | POA: Diagnosis not present

## 2016-08-09 MED ORDER — KETOROLAC TROMETHAMINE 30 MG/ML IJ SOLN
30.0000 mg | Freq: Once | INTRAMUSCULAR | Status: AC
  Administered 2016-08-09: 30 mg via INTRAMUSCULAR

## 2016-08-09 MED ORDER — KETOROLAC TROMETHAMINE 30 MG/ML IJ SOLN
INTRAMUSCULAR | Status: AC
  Filled 2016-08-09: qty 1

## 2016-08-09 MED ORDER — CYCLOBENZAPRINE HCL 5 MG PO TABS: 5.0000 mg | ORAL_TABLET | Freq: Three times a day (TID) | ORAL | 0 refills | Status: DC

## 2016-08-09 MED ORDER — TRAMADOL HCL 50 MG PO TABS: 50.0000 mg | ORAL_TABLET | Freq: Four times a day (QID) | ORAL | 1 refills | Status: DC | PRN

## 2016-08-09 NOTE — ED Triage Notes (Signed)
Pt  Reports back  Pain  Radiating  Around  To front of  abd   With  Some  Nausea  With  Onset of  Symptoms  abrubt   denys   Any  Injury  dentys  Any  Nausea  /  Vomiting        denys  Any    Any     Discharge      Or  Urinary  Symptoms

## 2016-08-09 NOTE — ED Provider Notes (Signed)
[facility]    CSN: [id number] Arrival date & time: 08/09/16  7829  First Provider Contact:  First MD Initiated Contact with Patient 08/09/16 2041        History   Chief Complaint Chief Complaint  Patient presents with  . Back Pain    HPI Maria Deleon is a 30 y.o. female.   The history is provided by the patient.  Back Pain  Location:  Thoracic spine Quality:  Stabbing Radiates to:  Does not radiate Pain severity:  Moderate Onset quality:  Sudden Duration:  2 days Timing:  Constant Progression:  Unchanged Chronicity:  New Context comment:  NKI Relieved by:  None tried Worsened by:  Movement and palpation Associated symptoms: no abdominal pain, no abdominal swelling, no bladder incontinence, no chest pain, no dysuria, no fever, no leg pain, no paresthesias and no weakness     Past Medical History:  Diagnosis Date  . TMJ (dislocation of temporomandibular joint)     There are no active problems to display for this patient.   Past Surgical History:  Procedure Laterality Date  . TUBAL LIGATION      OB History    Gravida Para Term Preterm AB Living   6 3 3   3 3    SAB TAB Ectopic Multiple Live Births   1 2             Home Medications    Prior to Admission medications   Medication Sig Start Date End Date Taking? Authorizing Provider  azithromycin (ZITHROMAX) 250 MG tablet Take all 4 tabs po now Patient not taking: Reported on 02/13/2015 02/12/15   Hayden Rasmussen, NP  metroNIDAZOLE (FLAGYL) 500 MG tablet Take 1 tablet (500 mg total) by mouth 2 (two) times daily. X 7 days 02/12/15   Hayden Rasmussen, NP    Family History No family history on file.  Social History Social History  Substance Use Topics  . Smoking status: Current Every Day Smoker    Packs/day: 0.50  . Smokeless tobacco: Not on file  . Alcohol use Yes     Allergies   Review of patient's allergies indicates no known allergies.   Review of Systems Review of Systems    Constitutional: Negative.  Negative for fever.  Respiratory: Negative.   Cardiovascular: Negative for chest pain.  Gastrointestinal: Negative.  Negative for abdominal pain.  Genitourinary: Negative.  Negative for bladder incontinence and dysuria.  Musculoskeletal: Positive for back pain. Negative for gait problem.  Neurological: Negative for weakness and paresthesias.  All other systems reviewed and are negative.    Physical Exam Triage Vital Signs ED Triage Vitals [08/09/16 2048]  Enc Vitals Group     BP 124/78     Pulse Rate 82     Resp 18     Temp 98.6 F (37 C)     Temp src      SpO2 100 %     Weight      Height      Head Circumference      Peak Flow      Pain Score      Pain Loc      Pain Edu?      Excl. in GC?    No data found.   Updated Vital Signs BP 124/78 (BP Location: Right Arm)   Pulse 82   Temp 98.6 F (37 C)   Resp 18   SpO2 100%   Visual Acuity Right Eye Distance:  Left Eye Distance:   Bilateral Distance:    Right Eye Near:   Left Eye Near:    Bilateral Near:     Physical Exam  Constitutional: She is oriented to person, place, and time. She appears well-developed and well-nourished. She appears distressed.  Neck: Normal range of motion. Neck supple.  Cardiovascular: Normal rate, regular rhythm, normal heart sounds and intact distal pulses.   Pulmonary/Chest: Effort normal and breath sounds normal. She exhibits no tenderness.  Abdominal: Soft. Bowel sounds are normal. She exhibits no mass. There is no tenderness. There is no rebound and no guarding. No hernia.  Musculoskeletal: She exhibits tenderness.       Thoracic back: She exhibits tenderness, bony tenderness, pain and spasm. She exhibits no swelling and normal pulse.       Back:  Lymphadenopathy:    She has no cervical adenopathy.  Neurological: She is alert and oriented to person, place, and time.  Skin: Skin is warm and dry.  Nursing note and vitals reviewed.    UC  Treatments / Results  Labs (all labs ordered are listed, but only abnormal results are displayed) Labs Reviewed - No data to display  EKG  EKG Interpretation None       Radiology No results found.  Procedures Procedures (including critical care time)  Medications Ordered in UC Medications  ketorolac (TORADOL) 30 MG/ML injection 30 mg (not administered)     Initial Impression / Assessment and Plan / UC Course  I have reviewed the triage vital signs and the nursing notes.  Pertinent labs & imaging results that were available during my care of the patient were reviewed by me and considered in my medical decision making (see chart for details).  Clinical Course      Final Clinical Impressions(s) / UC Diagnoses   Final diagnoses:  None    New Prescriptions New Prescriptions   No medications on file     Linna HoffJames D Estefana Taylor, MD 08/09/16 2103

## 2016-08-09 NOTE — Discharge Instructions (Signed)
Heat, rest and medicine as needed, return if needed

## 2018-07-04 ENCOUNTER — Emergency Department (HOSPITAL_COMMUNITY)
Admission: EM | Admit: 2018-07-04 | Discharge: 2018-07-04 | Disposition: A | Discharge status: Home / Self Care | Payer: Self-pay | Attending: Emergency Medicine | Admitting: Emergency Medicine

## 2018-07-04 ENCOUNTER — Encounter (HOSPITAL_COMMUNITY): Payer: Self-pay | Admitting: Emergency Medicine

## 2018-07-04 DIAGNOSIS — Z79899 Other long term (current) drug therapy: Secondary | ICD-10-CM | POA: Insufficient documentation

## 2018-07-04 DIAGNOSIS — Y929 Unspecified place or not applicable: Secondary | ICD-10-CM | POA: Insufficient documentation

## 2018-07-04 DIAGNOSIS — Y999 Unspecified external cause status: Secondary | ICD-10-CM | POA: Insufficient documentation

## 2018-07-04 DIAGNOSIS — S0531XA Ocular laceration without prolapse or loss of intraocular tissue, right eye, initial encounter: Secondary | ICD-10-CM | POA: Insufficient documentation

## 2018-07-04 DIAGNOSIS — F17210 Nicotine dependence, cigarettes, uncomplicated: Secondary | ICD-10-CM | POA: Insufficient documentation

## 2018-07-04 DIAGNOSIS — Y939 Activity, unspecified: Secondary | ICD-10-CM | POA: Insufficient documentation

## 2018-07-04 DIAGNOSIS — X58XXXA Exposure to other specified factors, initial encounter: Secondary | ICD-10-CM | POA: Insufficient documentation

## 2018-07-04 LAB — PROTIME-INR
INR: 1.09
Prothrombin Time: 14 seconds (ref 11.4–15.2)

## 2018-07-04 LAB — APTT: APTT: 33 s (ref 24–36)

## 2018-07-04 LAB — CBC
HCT: 46.6 % — ABNORMAL HIGH (ref 36.0–46.0)
HEMOGLOBIN: 15.7 g/dL — AB (ref 12.0–15.0)
MCH: 30 pg (ref 26.0–34.0)
MCHC: 33.7 g/dL (ref 30.0–36.0)
MCV: 88.9 fL (ref 78.0–100.0)
Platelets: 437 10*3/uL — ABNORMAL HIGH (ref 150–400)
RBC: 5.24 MIL/uL — ABNORMAL HIGH (ref 3.87–5.11)
RDW: 12.9 % (ref 11.5–15.5)
WBC: 16.7 10*3/uL — AB (ref 4.0–10.5)

## 2018-07-04 MED ORDER — HYDROCODONE-ACETAMINOPHEN 5-325 MG PO TABS
1.0000 | ORAL_TABLET | Freq: Once | ORAL | Status: AC
  Administered 2018-07-04: 1 via ORAL
  Filled 2018-07-04: qty 1

## 2018-07-04 MED ORDER — TETRACAINE HCL 0.5 % OP SOLN
2.0000 [drp] | Freq: Once | OPHTHALMIC | Status: AC
  Administered 2018-07-04: 2 [drp] via OPHTHALMIC
  Filled 2018-07-04: qty 4

## 2018-07-04 MED ORDER — BACITRACIN-POLYMYXIN B 500-10000 UNIT/GM OP OINT
TOPICAL_OINTMENT | Freq: Three times a day (TID) | OPHTHALMIC | Status: DC
  Administered 2018-07-04: 1 via OPHTHALMIC
  Filled 2018-07-04: qty 3.5

## 2018-07-04 MED ORDER — FLUORESCEIN SODIUM 1 MG OP STRP
1.0000 | ORAL_STRIP | Freq: Once | OPHTHALMIC | Status: AC
  Administered 2018-07-04: 1 via OPHTHALMIC
  Filled 2018-07-04: qty 1

## 2018-07-04 NOTE — ED Notes (Signed)
Pt reports poking herself in the inner corner of her right eye. Pt reports there was initially bloody discharge however now she just has some clear drainage. Pt reports blurry vision to the point she can only make out the outline of things and colors.

## 2018-07-04 NOTE — ED Triage Notes (Signed)
Pt reports she was trying to open a wine bottle yesterday when she ended up hitting herself in R eye with her thumb. Redness noted. Pt also reports blurry vision.

## 2018-07-04 NOTE — ED Provider Notes (Signed)
[facility]   CSN: [id number] Arrival date & time: 07/04/18  0216     History   Chief Complaint Chief Complaint  Patient presents with  . Eye Pain    HPI Maria Deleon is a 32 y.o. female.  The history is provided by the patient and a significant other.  Eye Injury  This is a new problem. The current episode started yesterday. The problem occurs constantly. The problem has been gradually worsening. Pertinent negatives include no headaches. Exacerbated by: bright light. Nothing relieves the symptoms. She has tried nothing for the symptoms.   Patient presents with right eye injury.  She reports she accidentally poked herself in the right eye with her thumb.  She reports that her thumb went far back into her eye.  She does not wear contact lenses.  She reports bleeding and drainage from the right eye.  She reports blurred vision from the right eye.  No fevers, no headache, no vomiting.  She also reports frequent rashes that are "broken blood vessels "as well as heavy vaginal bleeding and bleeding issues Past Medical History:  Diagnosis Date  . TMJ (dislocation of temporomandibular joint)     There are no active problems to display for this patient.   Past Surgical History:  Procedure Laterality Date  . TUBAL LIGATION       OB History    Gravida  6   Para  3   Term  3   Preterm      AB  3   Living  3     SAB  1   TAB  2   Ectopic      Multiple      Live Births               Home Medications    Prior to Admission medications   Medication Sig Start Date End Date Taking? Authorizing Provider  ibuprofen (ADVIL,MOTRIN) 200 MG tablet Take 800 mg by mouth every 6 (six) hours as needed for mild pain.   Yes [provider]    Family History No family history on file.  Social History Social History   Tobacco Use  . Smoking status: Current Every Day Smoker    Packs/day: 0.50  .  Smokeless tobacco: Never Used  Substance Use Topics  . Alcohol use: Yes  . Drug use: No     Allergies   Patient has no known allergies.   Review of Systems Review of Systems  Constitutional: Negative for fever.  Eyes: Positive for photophobia, pain, discharge, redness and visual disturbance.  Gastrointestinal: Negative for vomiting.  Neurological: Negative for headaches.  Hematological: Bruises/bleeds easily.  All other systems reviewed and are negative.    Physical Exam Updated Vital Signs BP (!) 125/97   Pulse 70   Temp 98.9 F (37.2 C) (Oral)   Resp 16   Ht 1.575 m (5\' 2" )   Wt 77.1 kg   SpO2 97%   BMI 31.09 kg/m   Physical Exam CONSTITUTIONAL: Well developed/well nourished HEAD: Normocephalic/atraumatic EYES: EOMI/PERRL, see photo below OD-subconjunctival hemorrhage noted, small healing wound to her medial portion of right eyelid., no corneal haziness, conjunctival laceration noted in OD ENMT: Mucous membranes moist NECK: supple no meningeal signs CV: S1/S2 noted, no murmurs/rubs/gallops noted LUNGS: Lungs are clear to auscultation bilaterally, no apparent distress ABDOMEN: soft NEURO: Pt is awake/alert/appropriate, moves all extremitiesx4.  No facial droop.   EXTREMITIES:  full ROM SKIN:  warm, color normal PSYCH: no abnormalities of mood noted, alert and oriented to situation            Patient gave verbal permission to utilize photo for medical documentation only The image was not stored on any personal device ED Treatments / Results  Labs (all labs ordered are listed, but only abnormal results are displayed) Labs Reviewed  CBC - Abnormal; Notable for the following components:      Result Value   WBC 16.7 (*)    RBC 5.24 (*)    Hemoglobin 15.7 (*)    HCT 46.6 (*)    Platelets 437 (*)    All other components within normal limits  PROTIME-INR  APTT    EKG None  Radiology No results found.  Procedures Procedures    Medications  Ordered in ED Medications  bacitracin-polymyxin b (POLYSPORIN) ophthalmic ointment (1 application Right Eye Given 07/04/18 0542)  tetracaine (PONTOCAINE) 0.5 % ophthalmic solution 2 drop (2 drops Right Eye Given 07/04/18 0324)  fluorescein ophthalmic strip 1 strip (1 strip Right Eye Given 07/04/18 0324)  HYDROcodone-acetaminophen (NORCO/VICODIN) 5-325 MG per tablet 1 tablet (1 tablet Oral Given 07/04/18 0438)     Initial Impression / Assessment and Plan / ED Course  I have reviewed the triage vital signs and the nursing notes.  Pertinent labs results that were available during my care of the patient were reviewed by me and considered in my medical decision making (see chart for details).     4:28 AM Suspect conjunctival laceration, will consult ophthalmology 5:51 AM Discussed the case with Dr. Randon GoldsmithLyles with ophthalmology I discussed the case in its entirety, including physical exam findings. I discussed my concern for conjunctival laceration.  He request starting bacitracin 3 times daily, and he can see her in the office today.  This was discussed at length with the patient, and she will follow-up closely with the ophthalmologist. Final Clinical Impressions(s) / ED Diagnoses   Final diagnoses:  Laceration of right conjunctiva, initial encounter    ED Discharge Orders    None       Zadie RhineWickline, Glorimar Stroope, MD 07/04/18 [phone]

## 2018-07-04 NOTE — Discharge Instructions (Addendum)
Please use the eye ointment 3 times a day, and please follow-up with the eye doctor today

## 2020-02-18 ENCOUNTER — Ambulatory Visit (INDEPENDENT_AMBULATORY_CARE_PROVIDER_SITE_OTHER): Payer: Commercial Managed Care - HMO

## 2020-02-18 ENCOUNTER — Other Ambulatory Visit: Payer: Self-pay

## 2020-02-18 ENCOUNTER — Encounter (HOSPITAL_COMMUNITY): Payer: Self-pay

## 2020-02-18 ENCOUNTER — Ambulatory Visit (HOSPITAL_COMMUNITY)
Admission: EM | Admit: 2020-02-18 | Discharge: 2020-02-18 | Disposition: A | Discharge status: Home / Self Care | Payer: Commercial Managed Care - HMO | Attending: Family Medicine | Admitting: Family Medicine

## 2020-02-18 DIAGNOSIS — W19XXXA Unspecified fall, initial encounter: Secondary | ICD-10-CM | POA: Diagnosis not present

## 2020-02-18 DIAGNOSIS — S62607A Fracture of unspecified phalanx of left little finger, initial encounter for closed fracture: Secondary | ICD-10-CM | POA: Diagnosis not present

## 2020-02-18 DIAGNOSIS — S62617A Displaced fracture of proximal phalanx of left little finger, initial encounter for closed fracture: Secondary | ICD-10-CM

## 2020-02-18 IMAGING — DX DG FINGER LITTLE 2+V*L*
3 series · 3 of 3 positions shown · non-contrast
Comparison: None.

CLINICAL DATA: Fall, hand injury

EXAM:
LEFT LITTLE FINGER 2+V

[finger ap]
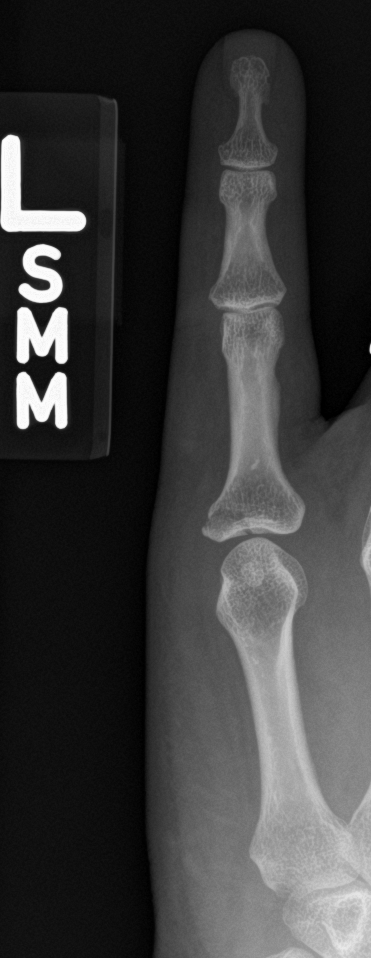

[finger obl]
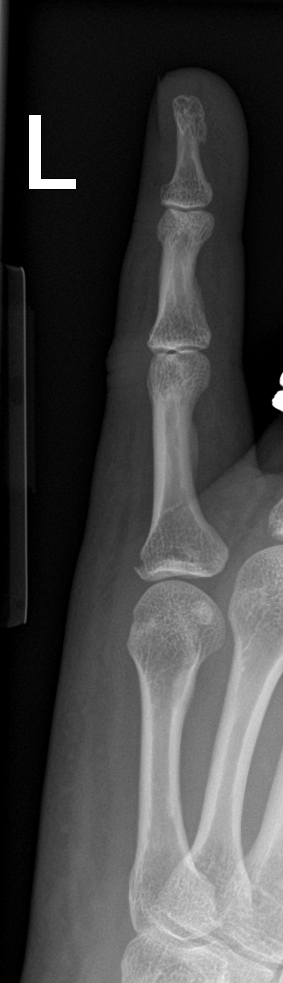

[finger lat]
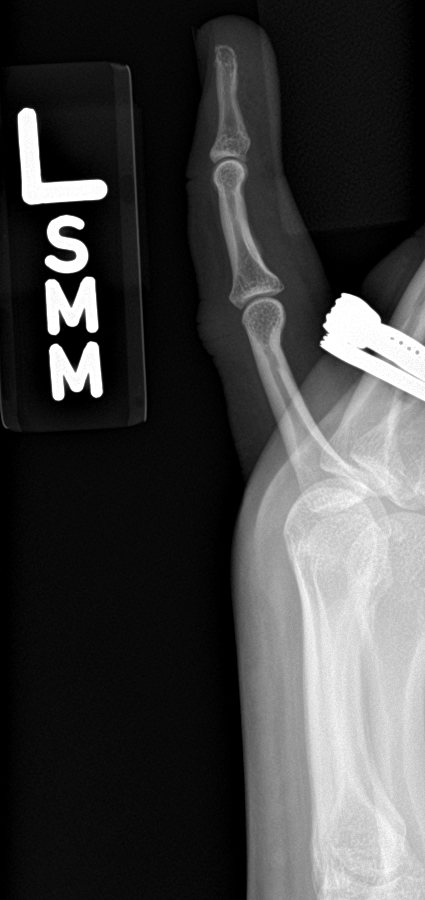

[3 of 3 positions shown; findings below may reference images not displayed]

FINDINGS: There is an oblique fracture through the base of the left fifth
proximal phalanx extending to the metacarpophalangeal joint. Minimal
displacement. Otherwise unremarkable.
IMPRESSION: Oblique fracture through the base of the left fifth proximal phalanx
extending to the MCP joint with minimal displacement.

## 2020-02-18 NOTE — ED Triage Notes (Signed)
C/o left hand pain, fifth digit is swollen and bruised. Patient tried with ibuprofen and ice to alleviate symptoms with no improvement.

## 2020-02-18 NOTE — Progress Notes (Signed)
Orthopedic Tech Progress Note Patient Details:  Maria Deleon Jul 02, 1986 [id number]  Ortho Devices Type of Ortho Device: Arm sling, Ulna gutter splint Ortho Device/Splint Location: LUE Ortho Device/Splint Interventions: Ordered, Application   Post Interventions Patient Tolerated: Well Instructions Provided: Adjustment of device, Care of device   Donald Pore 02/18/2020, 1:05 PM

## 2020-02-18 NOTE — Discharge Instructions (Signed)
You have a finger fracture We are placing you in a splint  Rest, ice, elevate Follow up with orthopedics

## 2020-02-19 NOTE — ED Provider Notes (Signed)
[facility]    CSN: [id number] Arrival date & time: 02/18/20  1114      History   Chief Complaint Chief Complaint  Patient presents with  . Hand Pain    HPI Maria Deleon is a 34 y.o. female.   Patient is a 34 year old female presents today with left hand pain, fifth digit pain, swelling and bruising.  Reporting this started after a fall landing on the hand. Symptoms have been constant.   She has been using ibuprofen and ice without much improvement of her symptoms.  Very limited range of motion of the finger.  Denies any numbness, tingling, loss of sensation.  ROS per HPI      Past Medical History:  Diagnosis Date  . TMJ (dislocation of temporomandibular joint)     There are no problems to display for this patient.   Past Surgical History:  Procedure Laterality Date  . TUBAL LIGATION      OB History    Gravida  6   Para  3   Term  3   Preterm      AB  3   Living  3     SAB  1   TAB  2   Ectopic      Multiple      Live Births               Home Medications    Prior to Admission medications   Medication Sig Start Date End Date Taking? Authorizing Provider  ibuprofen (ADVIL,MOTRIN) 200 MG tablet Take 800 mg by mouth every 6 (six) hours as needed for mild pain.    [provider]    Family History History reviewed. No pertinent family history.  Social History Social History   Tobacco Use  . Smoking status: Current Every Day Smoker    Packs/day: 0.50  . Smokeless tobacco: Never Used  Substance Use Topics  . Alcohol use: Yes  . Drug use: No     Allergies   Patient has no known allergies.   Review of Systems Review of Systems   Physical Exam Triage Vital Signs ED Triage Vitals  Enc Vitals Group     BP 02/18/20 1141 133/88     Pulse Rate 02/18/20 1141 80     Resp 02/18/20 1141 16     Temp 02/18/20 1141 97.8 F (36.6 C)     Temp Source 02/18/20 1141 Oral     SpO2 02/18/20 1141 97 %   Weight --      Height --      Head Circumference --      Peak Flow --      Pain Score 02/18/20 1140 6     Pain Loc --      Pain Edu? --      Excl. in GC? --    No data found.  Updated Vital Signs BP 133/88 (BP Location: Right Arm)   Pulse 80   Temp 97.8 F (36.6 C) (Oral)   Resp 16   LMP 02/14/2020 (Exact Date) Comment: tubal ligation  SpO2 97%   Visual Acuity Right Eye Distance:   Left Eye Distance:   Bilateral Distance:    Right Eye Near:   Left Eye Near:    Bilateral Near:     Physical Exam Vitals and nursing note reviewed.  Constitutional:      General: She is not in acute distress.    Appearance: Normal appearance. She is not ill-appearing,  toxic-appearing or diaphoretic.  HENT:     Head: Normocephalic.     Nose: Nose normal.  Eyes:     Conjunctiva/sclera: Conjunctivae normal.  Pulmonary:     Effort: Pulmonary effort is normal.  Musculoskeletal:     Left hand: Swelling, tenderness and bony tenderness present. Decreased range of motion.       Hands:     Cervical back: Normal range of motion.  Skin:    General: Skin is warm and dry.     Findings: No rash.  Neurological:     Mental Status: She is alert.  Psychiatric:        Mood and Affect: Mood normal.      UC Treatments / Results  Labs (all labs ordered are listed, but only abnormal results are displayed) Labs Reviewed - No data to display  EKG   Radiology DG Finger Little Left  Result Date: 02/18/2020 CLINICAL DATA:  Fall, hand injury EXAM: LEFT LITTLE FINGER 2+V COMPARISON:  None. FINDINGS: There is an oblique fracture through the base of the left fifth proximal phalanx extending to the metacarpophalangeal joint. Minimal displacement. Otherwise unremarkable. IMPRESSION: Oblique fracture through the base of the left fifth proximal phalanx extending to the MCP joint with minimal displacement. Electronically Signed   By: Macy Mis M.D.   On: 02/18/2020 12:16    Procedures Procedures  (including critical care time)  Medications Ordered in UC Medications - No data to display  Initial Impression / Assessment and Plan / UC Course  I have reviewed the triage vital signs and the nursing notes.  Pertinent labs & imaging results that were available during my care of the patient were reviewed by me and considered in my medical decision making (see chart for details).     Close displaced fracture of the proximal phalanx of the left little finger. Placing patient in ulnar gutter splint Recommended rest, ice and ibuprofen as needed for pain Contact given for orthopedic follow-up. Final Clinical Impressions(s) / UC Diagnoses   Final diagnoses:  Closed displaced fracture of proximal phalanx of left little finger, initial encounter     Discharge Instructions     You have a finger fracture We are placing you in a splint  Rest, ice, elevate Follow up with orthopedics     ED Prescriptions    None     PDMP not reviewed this encounter.   Orvan July, NP 02/19/20 [phone]

## 2020-02-23 ENCOUNTER — Encounter: Payer: Self-pay | Admitting: Family Medicine

## 2020-02-23 ENCOUNTER — Other Ambulatory Visit: Payer: Self-pay

## 2020-02-23 ENCOUNTER — Ambulatory Visit (INDEPENDENT_AMBULATORY_CARE_PROVIDER_SITE_OTHER): Payer: Commercial Managed Care - HMO | Admitting: Family Medicine

## 2020-02-23 DIAGNOSIS — F339 Major depressive disorder, recurrent, unspecified: Secondary | ICD-10-CM | POA: Diagnosis not present

## 2020-02-23 DIAGNOSIS — Z0000 Encounter for general adult medical examination without abnormal findings: Secondary | ICD-10-CM

## 2020-02-23 DIAGNOSIS — E669 Obesity, unspecified: Secondary | ICD-10-CM

## 2020-02-23 MED ORDER — SERTRALINE HCL 50 MG PO TABS: 50.0000 mg | ORAL_TABLET | Freq: Every day | ORAL | 3 refills | Status: DC

## 2020-02-23 NOTE — Patient Instructions (Signed)
It was a pleasure to meet you today.  We are going to collect some routine labs including a lipid panel and hemoglobin A1c which is a check for diabetes.  I will call you if any of those results are abnormal.  I am also going to go ahead and start you on Zoloft 50 mg a day.  I would like for you to follow-up regarding your depression in 1 month.

## 2020-02-23 NOTE — Progress Notes (Signed)
SUBJECTIVE:   CHIEF COMPLAINT / HPI:  New patient visit   Current concerns GI issues. Chronic abdominal pain approximately an hour in the morning. Lots of cramping. High acid food make it worse. No nausea of vomiting. Alternating diarrhea and constipation. Tums or pepto to help with little relief. Bloating in stomach. Has tried prilosec with little help. Will intermitently go 3-4 days without BM. Loose stools all the time.   Psych medications Patient with significant phsych hx. she does not remember what she was previously prescribed but does know that she was going to be transition to Abilify but lost her insurance so that was stopped.  On questioning patient on certain medications that she may or may not of tried patient reports that she has tried Prozac in the past for short period of time but stopped taking it because she was "young and did not want to take it".  She was also prescribed Zoloft at 1 point which she reported good results with but she also did not take that for an extended period of time.  Is open to restarting the Zoloft.  Would also like a referral to psychiatry.  PMH Bipolar disorder 2 Bipolar depression  Anxiety  Migraines   PSH  Tubal ligation- 2012   Family  Father- prostate, multiple CVA, depression, anxiety, HTN, HLD  Paternal grandmother- pancreatic cancer  Maternal grandfather- lung cancer  Maternal types 2 diabetes-  Sister- type 2 diabetes  Brother MI at 18  Father- multiple CVA   Social Lives home with husband and 3 children, 46 yo, 22 yo , 50 yo. Dog and and cat in the house. Unemployed at this time.Occasional social drinker. Smoke 1/2 ppd. Marijuana 3 g daily. Hx of cocaine abuse. Sexually active. Hx of HSV with outbreak years ago. Also HPV.   OBJECTIVE:   BP 128/70   Pulse 83   Ht 5\' 3"  (1.6 m)   Wt 220 lb 9.6 oz (100.1 kg)   LMP 02/14/2020 (Exact Date) Comment: tubal ligation  SpO2 96%   BMI 39.08 kg/m   General: NAD resting  comfortably. HEENT: Atraumatic. Normocephalic. Normal oropharynx without erythema, lesions, exudate.  Neck: No cervical lymphadenopathy.  Cardiac: RRR, no m/r/g Respiratory: CTAB, normal work of breathing Abdomen: soft, nontender, nondistended, bowel sounds normal Skin: warm and dry, no rashes noted Neuro: alert and oriented MSK: Patient has splint on the left arm which she reportedly has because she broke her wrist bowling approximately 1 week ago.  She also has an abrasion to her left knee which she reports she fell on hit her knee at the same time.   ASSESSMENT/PLAN:   Encounter for medical examination to establish care Patient presents to establish care.  I briefly addressed a few of her current concerns but will need follow-up visits.  Plan to follow-up in 2 weeks for Pap smear as well as address one of her other concerns. -Follow-up in 2 weeks for Pap smear -Lipid panel ordered given no plan history and patient is obese -Patient has strong family history for diabetes and is obese and will order hemoglobin A1c   Depression, recurrent (HCC) Patient reports longstanding history of depression which she has been treated for in the past but discontinued treatment due to loss of insurance.  She notes that she has been diagnosed with bipolar 2 along with bipolar depression.  Denies any mania symptoms.  Reports that she has tried Zoloft in the past but for short period of time.  She  reports that she liked the Zoloft and will be open to try it again. -Initiated Zoloft 50 mg daily.  I recognize that patient reports history of bipolar diagnosis but given that she has tried Zoloft in the past and reports success with this I would like to trial her on this medication. -Patient is scheduled for follow-up in 2 weeks for a Pap smear, I would like provider to check on medication tolerance at that time -Follow-up in 1 month to assess depression -Placed referral for psychiatry for patient per patient's  request  Obesity (BMI 30-39.9) Patient presents for new patient exam.  BMI on exam is 39.08.  Patient with strong family history of obesity and type 2 diabetes. -Lipid panel ordered -Hemoglobin A1c ordered     Gifford Shave, MD [place]

## 2020-02-23 NOTE — Assessment & Plan Note (Signed)
Patient reports longstanding history of depression which she has been treated for in the past but discontinued treatment due to loss of insurance.  She notes that she has been diagnosed with bipolar 2 along with bipolar depression.  Denies any mania symptoms.  Reports that she has tried Zoloft in the past but for short period of time.  She reports that she liked the Zoloft and will be open to try it again. -Initiated Zoloft 50 mg daily.  I recognize that patient reports history of bipolar diagnosis but given that she has tried Zoloft in the past and reports success with this I would like to trial her on this medication. -Patient is scheduled for follow-up in 2 weeks for a Pap smear, I would like provider to check on medication tolerance at that time -Follow-up in 1 month to assess depression -Placed referral for psychiatry for patient per patient's request

## 2020-02-23 NOTE — Assessment & Plan Note (Signed)
Patient presents to establish care.  I briefly addressed a few of her current concerns but will need follow-up visits.  Plan to follow-up in 2 weeks for Pap smear as well as address one of her other concerns. -Follow-up in 2 weeks for Pap smear -Lipid panel ordered given no plan history and patient is obese -Patient has strong family history for diabetes and is obese and will order hemoglobin A1c

## 2020-02-23 NOTE — Assessment & Plan Note (Signed)
Patient presents for new patient exam.  BMI on exam is 39.08.  Patient with strong family history of obesity and type 2 diabetes. -Lipid panel ordered -Hemoglobin A1c ordered

## 2020-02-24 LAB — LIPID PANEL
Chol/HDL Ratio: 6.6 ratio — ABNORMAL HIGH (ref 0.0–4.4)
Cholesterol, Total: 217 mg/dL — ABNORMAL HIGH (ref 100–199)
HDL: 33 mg/dL — ABNORMAL LOW (ref >39)
LDL Chol Calc (NIH): 155 mg/dL — ABNORMAL HIGH (ref 0–99)
Triglycerides: 157 mg/dL — ABNORMAL HIGH (ref 0–149)
VLDL Cholesterol Cal: 29 mg/dL (ref 5–40)

## 2020-02-24 LAB — HEMOGLOBIN A1C
Est. average glucose Bld gHb Est-mCnc: 114 mg/dL
Hgb A1c MFr Bld: 5.6 % (ref 4.8–5.6)

## 2020-02-25 ENCOUNTER — Telehealth: Payer: Self-pay

## 2020-02-25 NOTE — Telephone Encounter (Signed)
Spoke with patient regarding her test results.  Discussed that her cholesterol was high but not to the level that we would need to start medications at this time.  Encourage dietary changes and patient is interested in meeting with nutritionist but we will discuss this at her next visit.  Also informed her of her hemoglobin A1c.  I also asked her about any signs of mania given she just started an SSRI with a past diagnosis of bipolar 2 with bipolar depression.  Denies any signs or symptoms of mania and is aware of those signs and symptoms.  We will continue to monitor and I will follow up with her in 1 month regarding her depression.

## 2020-02-25 NOTE — Telephone Encounter (Signed)
Patient LVM on nurse line stating that she was returning phone call to provider regarding recent lab results. I was unable to find lab note or letter regarding recent lab results.   Please advise next steps for lab results  To PCP.

## 2020-03-08 ENCOUNTER — Encounter: Payer: Self-pay | Admitting: Family Medicine

## 2020-03-08 ENCOUNTER — Other Ambulatory Visit (HOSPITAL_COMMUNITY)
Admission: RE | Admit: 2020-03-08 | Discharge: 2020-03-08 | Disposition: A | Discharge status: Home / Self Care | Payer: Commercial Managed Care - HMO | Source: Ambulatory Visit | Attending: Family Medicine | Admitting: Family Medicine

## 2020-03-08 ENCOUNTER — Ambulatory Visit (INDEPENDENT_AMBULATORY_CARE_PROVIDER_SITE_OTHER): Payer: Commercial Managed Care - HMO | Admitting: Family Medicine

## 2020-03-08 ENCOUNTER — Other Ambulatory Visit: Payer: Self-pay

## 2020-03-08 VITALS — BP 110/72 | HR 88 | Ht 62.0 in | Wt 215.5 lb

## 2020-03-08 DIAGNOSIS — F339 Major depressive disorder, recurrent, unspecified: Secondary | ICD-10-CM

## 2020-03-08 DIAGNOSIS — Z113 Encounter for screening for infections with a predominantly sexual mode of transmission: Secondary | ICD-10-CM

## 2020-03-08 DIAGNOSIS — Z124 Encounter for screening for malignant neoplasm of cervix: Secondary | ICD-10-CM | POA: Insufficient documentation

## 2020-03-08 DIAGNOSIS — Z1159 Encounter for screening for other viral diseases: Secondary | ICD-10-CM | POA: Diagnosis not present

## 2020-03-08 DIAGNOSIS — N946 Dysmenorrhea, unspecified: Secondary | ICD-10-CM | POA: Insufficient documentation

## 2020-03-08 DIAGNOSIS — Z0000 Encounter for general adult medical examination without abnormal findings: Secondary | ICD-10-CM | POA: Diagnosis not present

## 2020-03-08 NOTE — Progress Notes (Signed)
SUBJECTIVE:   CHIEF COMPLAINT / HPI: STI testing, pap smear, dysmenorrhea  Maria Deleon returns today for pap smear and STI testing. She has not had any vaginal discharge, itching, or dysuria, reports no lesions. She has not had STI testing since her son was born (now 34 yo). She is married, and states that several years prior her husband had an affair. She does not have any symptoms, but would like routine testing. She reports no history of abnormal pap smears. She does have dysmenorrhea, very painful cramping to the point where she is not able to function normally, lies in bed curled in a ball. She does not plan to have more children and had a BTL after her last child, but is interested in hysterectomy. She tried an IUD and had an infection, with Nexplanon she had a pain syndrome, with depo provera she gained 60 lbs, she had a skin reaction to ortho evra, and does not like nuva ring. She is a smoker and has migraines with auras, so OCPs are not an appropriate choice. She requests referral to OB/GYN to inquire re: hysterectomy.  Mental Health: Maria Deleon states that she is doing well since starting zoloft again. She is able to wake up with her children and get them ready for school, has energy to clean the house, is more functional. She has not had any insomnia, grandiose ideas, risky sexual/financial behavior, does not believe she has experienced mania or hypomania. No SI/HI. Her PHQ-9 scores are about the same, but in terms of her daily functioning, she seems to be improving.  PERTINENT  PMH / PSH: tobacco use, migraine w/ aura, BPD type II  OBJECTIVE:   BP 110/72   Pulse 88   Ht 5\' 2"  (1.575 m)   Wt 215 lb 8 oz (97.8 kg)   LMP 02/14/2020 (Exact Date) Comment: tubal ligation  SpO2 96%   BMI 39.42 kg/m   Physical Exam Vitals and nursing note reviewed. Exam conducted with a chaperone present.  Constitutional:      General: She is not in acute distress.    Appearance: Normal  appearance. She is obese. She is not ill-appearing, toxic-appearing or diaphoretic.  HENT:     Head: Normocephalic and atraumatic.  Cardiovascular:     Rate and Rhythm: Normal rate and regular rhythm.     Pulses: Normal pulses.     Heart sounds: Normal heart sounds. No murmur. No friction rub. No gallop.   Pulmonary:     Effort: Pulmonary effort is normal.     Breath sounds: Normal breath sounds. No wheezing, rhonchi or rales.  Genitourinary:    Exam position: Lithotomy position.     Labia:        Right: No rash, tenderness or lesion.        Left: No rash, tenderness or lesion.      Comments: PELVIC:  Normal appearing external female genitalia, normal vaginal epithelium, no abnormal discharge. Normal appearing cervix.  Musculoskeletal:     Right lower leg: No edema.     Left lower leg: No edema.  Neurological:     General: No focal deficit present.     Mental Status: She is alert and oriented to person, place, and time. Mental status is at baseline.  Psychiatric:        Mood and Affect: Mood normal.        Behavior: Behavior normal.    Depression screen [facility] 2/9 03/08/2020 03/08/2020 02/23/2020  Decreased Interest 2  2 2  Down, Depressed, Hopeless 2 2 2   PHQ - 2 Score 4 4 4   Altered sleeping 3 - 3  Tired, decreased energy 3 - 3  Change in appetite 3 - 3  Feeling bad or failure about yourself  2 - 1  Trouble concentrating 3 - 3  Moving slowly or fidgety/restless 3 - 3  Suicidal thoughts 0 - 0  PHQ-9 Score 21 - 20  Difficult doing work/chores Very difficult - -    ASSESSMENT/PLAN:  Maria Deleon is a 34 yo woman presenting today for routine pap smear and STI testing.  Dysmenorrhea Patient has long history of dysmenorrhea, likely primary, unable to r/o secondary at this time. She has tried nearly all forms of hormonal birth control without improvement and due to risk factors for DVT, is not an appropriate candidate for OCPs. She requests referral to OB/GYN to inquire  re: hysterectomy or other treatment options. -Referral to OB/GYN placed  Healthcare maintenance - Pap smear performed today -GC/Ch, trichomonas, RPR, HIV, Hep C obtained  Depression, recurrent (HCC) No signs of mania, nor HI/SI. Improved daily functioning with sertraline.  - Follow up 2 weeks to assess depression and SSRI     Gladys Damme, MD No Name

## 2020-03-08 NOTE — Assessment & Plan Note (Addendum)
No signs of mania, nor HI/SI. Improved daily functioning with sertraline.  - Follow up 2 weeks to assess depression and SSRI

## 2020-03-08 NOTE — Assessment & Plan Note (Signed)
-   Pap smear performed today -GC/Ch, trichomonas, RPR, HIV, Hep C obtained

## 2020-03-08 NOTE — Assessment & Plan Note (Signed)
Patient has long history of dysmenorrhea, likely primary, unable to r/o secondary at this time. She has tried nearly all forms of hormonal birth control without improvement and due to risk factors for DVT, is not an appropriate candidate for OCPs. She requests referral to OB/GYN to inquire re: hysterectomy or other treatment options. -Referral to OB/GYN placed

## 2020-03-08 NOTE — Patient Instructions (Addendum)
It was a pleasure to meet you!  We did some lab work and a pap smear today, I will call you if anything changes.  I also referred you to OBGYN to discuss possible hysterectomy and treatments for dysmenorrhea (bad period pain).  I recommend you schedule an appointment in 2 weeks.   Be Well!  Dr. Leary Roca   If you are feeling suicidal or depression symptoms worsen please immediately go to:   24 Hour Availability [facility]  [address], [place], [place] [address]  [phone] or [phone]  . If you are thinking about harming yourself or having thoughts of suicide, or if you know someone who is, seek help right away. . Call your doctor or mental health care provider. . Call 911 or go to a hospital emergency room to get immediate help, or ask a friend or family member to help you do these things. . Call the [place] National Suicide Prevention Lifeline's toll-free, 24-hour hotline at 1-800-273-TALK 418-828-3822) or TTY: 1-800-799-4 TTY 410-577-8630) to talk to a trained counselor. . If you are in crisis, make sure you are not left alone.  . If someone else is in crisis, make sure he or she is not left alone   [facility] the [employer] (Domestic Violence, Rape & Victim Assistance [phone]   [facility] [employer] [facility]    (ONLY from 8am-4pm)    [phone]  [facility] Mobile Crisis Unit (24/7)   [phone]  [place] National Suicide Hotline   ([phone] [phone] Len Childs)

## 2020-03-09 ENCOUNTER — Telehealth: Payer: Self-pay | Admitting: Family Medicine

## 2020-03-09 LAB — CYTOLOGY - PAP
Chlamydia: NEGATIVE
Comment: NEGATIVE
Comment: NEGATIVE
Comment: NEGATIVE
Comment: NORMAL
Diagnosis: UNDETERMINED — AB
High risk HPV: NEGATIVE
Neisseria Gonorrhea: NEGATIVE
Trichomonas: NEGATIVE

## 2020-03-09 LAB — HIV ANTIBODY (ROUTINE TESTING W REFLEX): HIV Screen 4th Generation wRfx: NONREACTIVE

## 2020-03-09 LAB — HCV AB W REFLEX TO QUANT PCR: HCV Ab: <0.1 s/co ratio (ref 0.0–0.9)

## 2020-03-09 LAB — HCV INTERPRETATION

## 2020-03-09 LAB — RPR: RPR Ser Ql: NONREACTIVE

## 2020-03-09 NOTE — Telephone Encounter (Signed)
Patient returned phone call to nurse line and informed of below.   Alinna Siple C Gerald Kuehl, RN  

## 2020-03-09 NOTE — Telephone Encounter (Signed)
Called patient and left HIPAA compliant VM. All STI tests were negative including HPV. Pap smear showed ASC-US (atypical cells of unclear significance), all this means is that we will need to repeat a pap smear and HPV testing in 3 years.   Shirlean Mylar, MD [facility] Family Medicine Residency, PGY-1

## 2020-03-18 ENCOUNTER — Telehealth: Payer: Self-pay | Admitting: Psychology

## 2020-03-18 ENCOUNTER — Ambulatory Visit (INDEPENDENT_AMBULATORY_CARE_PROVIDER_SITE_OTHER): Payer: Commercial Managed Care - HMO | Admitting: Family Medicine

## 2020-03-18 ENCOUNTER — Other Ambulatory Visit: Payer: Self-pay

## 2020-03-18 ENCOUNTER — Encounter: Payer: Self-pay | Admitting: Family Medicine

## 2020-03-18 VITALS — BP 108/78 | HR 78 | Ht 62.0 in | Wt 217.8 lb

## 2020-03-18 DIAGNOSIS — Z23 Encounter for immunization: Secondary | ICD-10-CM

## 2020-03-18 DIAGNOSIS — F339 Major depressive disorder, recurrent, unspecified: Secondary | ICD-10-CM | POA: Diagnosis not present

## 2020-03-18 NOTE — Patient Instructions (Addendum)
It was wonderful to see you today!  I am glad you are doing so well on your medication.  I want to continue this dose of Zoloft and we can follow-up in a month or 2 regarding your symptoms.  Please if you notice that you are going downhill call our clinic and schedule an appointment with me and we can make some adjustments.  I spoke with our therapist and she will call you today to schedule an appointment.  This is a short-term therapy program meaning you get a few visits with her so if you wish for long-term therapy you will need to establish with another therapist in the future.  We can help you with information on that.  If you have any questions or concerns please feel free to reach out.  Have a wonderful day!  Go online to COVID19 testing or call [phone] to schedule testing.   3 locations for drive up testing including:  [facility] Ramp (old [facility] in [place]) [address] [place], [place] [address]  [place] [address] [place], [place] [address]  [address] (near [place] in [place]) [place], [place] [address]  Patient advised of hours 8am-4pm and that they should be in line by 3 PM.  -ED precautions discussed and patient expressed good understanding -Patient counseled on wearing a mask, washing hands -Patient instructed to avoid others until she meets criteria for ending isolation after any suspected COVID, which are:  -3 days with no fever and  -Respiratory symptoms have improved (e.g. cough, shortness of breath) or  -10 days since symptoms first appeared

## 2020-03-18 NOTE — Progress Notes (Signed)
    SUBJECTIVE:   CHIEF COMPLAINT / HPI:   Depression follow-up Patient reports her depression is being well managed on the Zoloft.  She reports she is able to cope with things better and is feels much better.  Examples of this are that she can handle loud noises and a full house without getting upset.  She denies any signs or symptoms of mania.  After more questions about manic episodes she reports that she went days without sleep many many years ago but that was while she was abusing illicit substances.  Has not had any since that time.  Denies any SI or HI at this time.   COVID-19 exposure Patient's report that they had exposure to someone with COVID-19 approximately a week ago.  The person tested positive last weekend and they are requesting information regarding testing.  Denies any symptoms at this time.  No one else in the house is sick either.  They are unvaccinated at this time but would like to be vaccinated in the near future.  Information provided  PERTINENT  PMH / PSH: Bipolar 2  OBJECTIVE:   BP 108/78   Pulse 78   Ht 5\' 2"  (1.575 m)   Wt 217 lb 12.8 oz (98.8 kg)   LMP 03/14/2020 (Approximate)   SpO2 96%   BMI 39.84 kg/m   General: Well-appearing, no acute distress Cardio: Regular rate and rhythm, no murmurs appreciated Respiratory: Clear to auscultation bilaterally, normal work of breathing Abdomen: Positive bowel sounds PHQ9 SCORE ONLY 03/18/2020 03/18/2020 03/08/2020  Score 9 2 21      ASSESSMENT/PLAN:   Depression, recurrent (HCC) No signs or symptoms of mania.  Denies SI or HI.  Feels like she is doing very well on her Zoloft. -Follow-up in 1 month for monitoring of symptoms -Spoke with Dr. 03/10/2020 regarding therapy sessions -Continue medications as prescribed -Monitor for symptoms of mania     , MD [facility]

## 2020-03-18 NOTE — Assessment & Plan Note (Signed)
No signs or symptoms of mania.  Denies SI or HI.  Feels like she is doing very well on her Zoloft. -Follow-up in 1 month for monitoring of symptoms -Spoke with Dr. Shawnee Knapp regarding therapy sessions -Continue medications as prescribed -Monitor for symptoms of mania

## 2020-03-18 NOTE — Telephone Encounter (Signed)
Schedule BH appt in person for 5/7 at 11am.  Informed pt insurance will be charged for visit.

## 2020-03-22 ENCOUNTER — Ambulatory Visit: Payer: Commercial Managed Care - HMO | Attending: Internal Medicine

## 2020-03-22 DIAGNOSIS — Z20822 Contact with and (suspected) exposure to covid-19: Secondary | ICD-10-CM

## 2020-03-23 LAB — NOVEL CORONAVIRUS, NAA: SARS-CoV-2, NAA: DETECTED — AB

## 2020-03-23 LAB — SARS-COV-2, NAA 2 DAY TAT

## 2020-03-24 ENCOUNTER — Telehealth: Payer: Self-pay | Admitting: Unknown Physician Specialty

## 2020-03-24 ENCOUNTER — Encounter: Payer: Self-pay | Admitting: Unknown Physician Specialty

## 2020-03-24 ENCOUNTER — Other Ambulatory Visit: Payer: Self-pay | Admitting: Unknown Physician Specialty

## 2020-03-24 DIAGNOSIS — U071 COVID-19: Secondary | ICD-10-CM

## 2020-03-24 DIAGNOSIS — E669 Obesity, unspecified: Secondary | ICD-10-CM

## 2020-03-24 NOTE — Telephone Encounter (Signed)
Called to discuss with patient about Covid symptoms and the use of bamlanivimab, a monoclonal antibody infusion for those with mild to moderate Covid symptoms and at a high risk of hospitalization.  Pt is qualified for this infusion at the [facility] due to BMI>35   Message left to call back  

## 2020-03-24 NOTE — Telephone Encounter (Signed)
  I connected by phone with Maria Deleon on 03/24/2020 at 3:36 PM to discuss the potential use of an new treatment for mild to moderate COVID-19 viral infection in non-hospitalized patients.  This patient is a 34 y.o. female that meets the FDA criteria for Emergency Use Authorization of bamlanivimab/etesevimab or casirivimab/imdevimab.  Has a (+) direct SARS-CoV-2 viral test result  Has mild or moderate COVID-19   Is ? 34 years of age and weighs ? 40 kg  Is NOT hospitalized due to COVID-19  Is NOT requiring oxygen therapy or requiring an increase in baseline oxygen flow rate due to COVID-19  Is within 10 days of symptom onset  Has at least one of the high risk factor(s) for progression to severe COVID-19 and/or hospitalization as defined in EUA.  Specific high risk criteria : BMI >/= 35   I have spoken and communicated the following to the patient or parent/caregiver:  1. FDA has authorized the emergency use of bamlanivimab/etesevimab and casirivimab\imdevimab for the treatment of mild to moderate COVID-19 in adults and pediatric patients with positive results of direct SARS-CoV-2 viral testing who are 69 years of age and older weighing at least 40 kg, and who are at high risk for progressing to severe COVID-19 and/or hospitalization.  2. The significant known and potential risks and benefits of bamlanivimab/etesevimab and casirivimab\imdevimab, and the extent to which such potential risks and benefits are unknown.  3. Information on available alternative treatments and the risks and benefits of those alternatives, including clinical trials.  4. Patients treated with bamlanivimab/etesevimab and casirivimab\imdevimab should continue to self-isolate and use infection control measures (e.g., wear mask, isolate, social distance, avoid sharing personal items, clean and disinfect "high touch" surfaces, and frequent handwashing) according to CDC guidelines.   5. The patient or  parent/caregiver has the option to accept or refuse bamlanivimab/etesevimab or casirivimab\imdevimab .  After reviewing this information with the patient, The patient agreed to proceed with receiving the bamlanimivab infusion and will be provided a copy of the Fact sheet prior to receiving the infusion.Gabriel Cirri 03/24/2020 3:36 PM

## 2020-03-25 ENCOUNTER — Ambulatory Visit (HOSPITAL_COMMUNITY)
Admission: RE | Admit: 2020-03-25 | Discharge: 2020-03-25 | Disposition: A | Discharge status: Home / Self Care | Payer: Commercial Managed Care - HMO | Source: Ambulatory Visit | Attending: Pulmonary Disease | Admitting: Pulmonary Disease

## 2020-03-25 DIAGNOSIS — U071 COVID-19: Secondary | ICD-10-CM | POA: Diagnosis present

## 2020-03-25 DIAGNOSIS — E669 Obesity, unspecified: Secondary | ICD-10-CM | POA: Insufficient documentation

## 2020-03-25 MED ORDER — DIPHENHYDRAMINE HCL 50 MG/ML IJ SOLN: 50.0000 mg | Freq: Once | INTRAMUSCULAR | Status: DC | PRN

## 2020-03-25 MED ORDER — ALBUTEROL SULFATE HFA 108 (90 BASE) MCG/ACT IN AERS: 2.0000 | INHALATION_SPRAY | Freq: Once | RESPIRATORY_TRACT | Status: DC | PRN

## 2020-03-25 MED ORDER — EPINEPHRINE 0.3 MG/0.3ML IJ SOAJ: 0.3000 mg | Freq: Once | INTRAMUSCULAR | Status: DC | PRN

## 2020-03-25 MED ORDER — FAMOTIDINE IN NACL 20-0.9 MG/50ML-% IV SOLN: 20.0000 mg | Freq: Once | INTRAVENOUS | Status: DC | PRN

## 2020-03-25 MED ORDER — SODIUM CHLORIDE 0.9 % IV SOLN: INTRAVENOUS | Status: DC | PRN

## 2020-03-25 MED ORDER — SODIUM CHLORIDE 0.9 % IV SOLN
Freq: Once | INTRAVENOUS | Status: AC
  Filled 2020-03-25: qty 700

## 2020-03-25 MED ORDER — METHYLPREDNISOLONE SODIUM SUCC 125 MG IJ SOLR: 125.0000 mg | Freq: Once | INTRAMUSCULAR | Status: DC | PRN

## 2020-03-25 NOTE — Progress Notes (Signed)
  Diagnosis: COVID-19  Physician:Dr Wright  Procedure: Covid Infusion Clinic Med: bamlanivimab\etesevimab infusion - Provided patient with bamlanimivab\etesevimab fact sheet for patients, parents and caregivers prior to infusion.  Complications: No immediate complications noted.  Discharge: Discharged home   Maria Deleon 03/25/2020  

## 2020-03-25 NOTE — Discharge Instructions (Signed)

## 2020-03-26 ENCOUNTER — Telehealth (INDEPENDENT_AMBULATORY_CARE_PROVIDER_SITE_OTHER): Payer: Commercial Managed Care - HMO | Admitting: Student in an Organized Health Care Education/Training Program

## 2020-03-26 ENCOUNTER — Ambulatory Visit: Payer: Commercial Managed Care - HMO | Admitting: Psychology

## 2020-03-26 ENCOUNTER — Telehealth: Payer: Self-pay

## 2020-03-26 DIAGNOSIS — G4489 Other headache syndrome: Secondary | ICD-10-CM | POA: Diagnosis not present

## 2020-03-26 MED ORDER — FLUTICASONE PROPIONATE 50 MCG/ACT NA SUSP
2.0000 | Freq: Every day | NASAL | 6 refills | Status: DC
Start: 2020-03-26 — End: 2023-05-10

## 2020-03-26 NOTE — Progress Notes (Signed)
[facility] Telemedicine Visit  Patient consented to have virtual visit and was identified by name and date of birth. Method of visit: Video  Encounter participants: Patient: Maria Deleon - located at home Provider: Leeroy Bock - located at [facility] Others (if applicable): none  Chief Complaint: Cough, headache in setting of COVID  HPI:  Patient has been having a headache for about a week associated with sinus congestion and rhinorrhea which has now decreased. The headache is worse with movement and interfering with sleep. Tylenol not helping. No fevers, N, V. Currently s/p 1 infusion therapy.  ROS: per HPI  Pertinent PMHx: COVID+  Exam:  LMP 03/14/2020 (Approximate)   Appears ill, but non-toxic. Respiratory: congestion apparent in voice. No cough or conversational dyspnea.  Pain elicited with percussion of frontal sinuses and worse on maxillary sinus  Assessment/Plan:  Headache In association with known covid infection.  Likely secondary to sinus congestion primarily.  Prescribing flonase. Patient has [employer]vertising account planner.    Time spent during visit with patient: 12 minutes

## 2020-03-26 NOTE — Telephone Encounter (Signed)
Patient calls nurse line regarding COVID symptoms. Patient tested positive on 03/22/20. Patient reports headache, cough, congestion. Patient reports taking tylenol extra strength with little relief. Scheduled patient with access to care this afternoon for virtual visit.   Patient also cancelled appointment with Dr. Shawnee Knapp due to testing positive and will reschedule when she is feeling better.    ED precautions given    To PCP and Dr. Dareen Piano (ATC provider)  Veronda Prude, RN

## 2020-04-02 DIAGNOSIS — R519 Headache, unspecified: Secondary | ICD-10-CM | POA: Insufficient documentation

## 2020-04-02 NOTE — Assessment & Plan Note (Signed)
In association with known covid infection.  Likely secondary to sinus congestion primarily.  Prescribing flonase. Patient has Advertising account planner.

## 2020-04-08 ENCOUNTER — Other Ambulatory Visit: Payer: Self-pay

## 2020-04-08 ENCOUNTER — Ambulatory Visit (HOSPITAL_COMMUNITY): Payer: 59 | Admitting: Psychiatry

## 2020-07-11 ENCOUNTER — Other Ambulatory Visit: Payer: Self-pay | Admitting: Family Medicine

## 2020-07-28 ENCOUNTER — Ambulatory Visit: Payer: Medicaid Other | Admitting: Family Medicine

## 2020-08-24 ENCOUNTER — Ambulatory Visit: Payer: Medicaid Other | Admitting: Family Medicine

## 2020-12-02 ENCOUNTER — Other Ambulatory Visit: Payer: Self-pay

## 2020-12-02 ENCOUNTER — Ambulatory Visit (INDEPENDENT_AMBULATORY_CARE_PROVIDER_SITE_OTHER): Payer: 59 | Admitting: Family Medicine

## 2020-12-02 VITALS — BP 106/62 | HR 84

## 2020-12-02 DIAGNOSIS — J029 Acute pharyngitis, unspecified: Secondary | ICD-10-CM | POA: Diagnosis not present

## 2020-12-02 MED ORDER — PANTOPRAZOLE SODIUM 40 MG PO TBEC: 40.0000 mg | DELAYED_RELEASE_TABLET | Freq: Every day | ORAL | 0 refills | Status: DC

## 2020-12-02 NOTE — Patient Instructions (Addendum)
[facility].  Address: [address] [place], [place] [address] [phone]  We have tested you for covid today, should be back in the next 1-2 days. Make sure you drink plenty of water. You can start taking protonix, which is an acid medication. You can still use the tums whenever the burning is very bothersome.   IF your symptoms are not improving by the next week or worsening (not staying hydrated, recurrent vomiting, severe abdominal pain, or difficulty swallowing) please follow up or go to ED.

## 2020-12-02 NOTE — Progress Notes (Signed)
    SUBJECTIVE:   CHIEF COMPLAINT / HPI: Throat burning/vomiting   Ms. Maria Deleon is a 35 year old female presenting for an evaluation of throat burning/emesis.   Reports symptoms starting on Monday, 1/10, starting having a burning sensation in her mouth/throat after waking up in the morning. "like a bee stung inside my mouth."  The sensation is better, but still present and feels "weird."  Vomited due to this about 3 times, orange/yellow/watery in color. Last emesis Tuesday night.  Tums does help some but sensation comes back.  States she had a fever two weeks ago, but not sure if she had one recently (hasn't checked her temp)--does state at night she has been waking up sweaty and then feel really cold). Today she feels fatigued with some mild generalized muscle aches. More dry cough in the past few days with some sore throat/congestion, however always has a little cough. Poor appetite but still eating and able to keep it down but a little feel a little nausea. No abdominal pain. Reports intermittent history of GERD, but no where near the severity of this. No rash. No one else sick at home.   She is not vaccinated against against COVID, but considering it. No known covid exposures. Works at [employer], wears mask at work most times.   Send message victor about GI.   PERTINENT  PMH / PSH: fibromyalgia, headaches, depression   OBJECTIVE:   BP 106/62   Pulse 84   SpO2 98%   General: Alert, NAD, non-toxic appearing  HEENT: NCAT, MMM, posterior oropharynx slightly erythematous without any petechiae, no tonsillar hypertrophy or drainage, airway patent, no anterior/posterior cervical lymphadenopathy or masses palpated, bilateral TM clear with appropriate light reflex Cardiac: RRR no m/g/r Lungs: Clear bilaterally, no increased WOB on room air Abdomen: soft, non-tender throughout all 4 quadrants, non-distended, normoactive BS Msk: Moves all extremities spontaneously  Ext: Warm, dry, 2+ distal  pulses  ASSESSMENT/PLAN:   Sore throat Significant burning sensation with associated yellow emesis, cough, anorexia, and muscle aches for the past few days.  Unclear etiology, however given constellation of symptoms suspect likely 2/2 viral illness.  Could also consider severe reflux, however would not cause additional symptoms.  Reassuringly without abdominal pain, less likely pancreatitis or GI pathology.  Relatively unremarkable ENT exam, doubt tonsillar or retropharyngeal abscess.  Tested for COVID today.  Rx'd Protonix daily for relief, Tums PRN.  Encouraged hydration.    Recommended following up next week (pending COVID test) if not improving at all or sooner if worsening including inability to maintain hydration, recurrent emesis, or sensation becoming more severe.  Maria Stack, DO [facility]

## 2020-12-04 ENCOUNTER — Encounter: Payer: Self-pay | Admitting: Family Medicine

## 2020-12-04 DIAGNOSIS — J029 Acute pharyngitis, unspecified: Secondary | ICD-10-CM | POA: Insufficient documentation

## 2020-12-04 LAB — NOVEL CORONAVIRUS, NAA: SARS-CoV-2, NAA: NOT DETECTED

## 2020-12-04 LAB — SARS-COV-2, NAA 2 DAY TAT

## 2020-12-04 NOTE — Assessment & Plan Note (Signed)
Significant burning sensation with associated yellow emesis, cough, anorexia, and muscle aches for the past few days.  Unclear etiology, however given constellation of symptoms suspect likely 2/2 viral illness.  Could also consider severe reflux, however would not cause additional symptoms.  Reassuringly without abdominal pain, less likely pancreatitis or GI pathology.  Relatively unremarkable ENT exam, doubt tonsillar or retropharyngeal abscess.  Tested for COVID today.  Rx'd Protonix daily for relief, Tums PRN.  Encouraged hydration.

## 2021-01-10 NOTE — Progress Notes (Signed)
    SUBJECTIVE:   CHIEF COMPLAINT / HPI: fever and flank pain   Fever and Flank Pain   Patient reports she has been experiencing lower back pain for 4 days. Patient states that she has been having very small volumes of urination for the same amount of time. She also reports a fever 102 degrees. She has been taking Tylenol and ibuprofen for pain and fever reduction. She denies any nausea or vomiting. She denies dysuria.  PERTINENT  PMH / PSH:  MDD  Dysmenorrhea   OBJECTIVE:   BP 104/60   Pulse 76   SpO2 98%   General: female appearing stated age in no acute distress Cardio: Normal S1 and S2, no S3 or S4. Rhythm is regular. No murmurs or rubs.  Bilateral radial pulses palpable Pulm: Clear to auscultation bilaterally, no crackles, wheezing, or diminished breath sounds. Normal respiratory effort, stable on RA Abdomen: Bowel sounds normal. Abdomen soft, tenderness in suprapubic region, CVA tenderness   ASSESSMENT/PLAN:   Flank pain Patient reporting right flank pain in bilateral lower back pain for 4 days. Patient states that she is at decreased urinary output. She reports abdominal bloating. Patient reports no personal history of nephrolithiasis however her parents both have frequent episodes. She has not noticed any hematuria. Likely on differential considering pyelonephritis given fever of 102 reported 4 days ago. Urinalysis consistent with acute UTI. Could also consider nephrolithiasis for amount of reported pain. Concern for urinary retention due to small volume of urine provided for sample and patient's report of low urinary output for the past 4 days. Dr. Manson Passey completed bladder ultrasound with 25mL of urine in bladder. -Urine analysis -CBC -CMP    Ronnald Ramp, MD [facility]

## 2021-01-11 ENCOUNTER — Ambulatory Visit (INDEPENDENT_AMBULATORY_CARE_PROVIDER_SITE_OTHER): Payer: 59 | Admitting: Family Medicine

## 2021-01-11 ENCOUNTER — Other Ambulatory Visit: Payer: Self-pay

## 2021-01-11 VITALS — BP 104/60 | HR 76

## 2021-01-11 DIAGNOSIS — R109 Unspecified abdominal pain: Secondary | ICD-10-CM

## 2021-01-11 LAB — POCT URINALYSIS DIP (MANUAL ENTRY)
Bilirubin, UA: NEGATIVE
Glucose, UA: NEGATIVE mg/dL
Ketones, POC UA: NEGATIVE mg/dL
Leukocytes, UA: NEGATIVE
Nitrite, UA: NEGATIVE
Protein Ur, POC: NEGATIVE mg/dL
Spec Grav, UA: >=1.03 — AB (ref 1.010–1.025)
Urobilinogen, UA: 0.2 E.U./dL
pH, UA: 5.5 (ref 5.0–8.0)

## 2021-01-11 MED ORDER — SERTRALINE HCL 50 MG PO TABS: 50.0000 mg | ORAL_TABLET | Freq: Every day | ORAL | 0 refills | Status: DC

## 2021-01-11 MED ORDER — KETOROLAC TROMETHAMINE 30 MG/ML IJ SOLN: 30.0000 mg | Freq: Once | INTRAMUSCULAR | Status: AC

## 2021-01-11 NOTE — Patient Instructions (Addendum)
Please follow up with your PCP in 2 weeks.    Psychiatry Resource List (Adults and Children) Most of these providers will take Medicaid. please consult your insurance for a complete and updated list of available providers. When calling to make an appointment have your insurance information available to confirm you are covered.   [facility]:Psychiatry and Counseling [address] [place], [place] [address] [phone]  [facility]  [address] [place], [place] [phone] Crisis [phone]   [facility]:   [facility]: [address]     [phone]   [facility]: [address] [place]. [place],        [phone] [place]: [address],    [phone] Kathryne Sharper: [address],                   [phone] Children: [facility] [address]         [phone]   [facility]  (Psychiatry only; Adults /children 12 and over, will take Medicaid)  [address] [place]urell Josephs [address], [place], [place] [address]       [phone]   [facility] (Psychiatry & counseling ; adults & children ; will take Medicaid [address]  [place] [address]  Go on-line to complete referral ( [email or web address]://[email or web address].[email or web address]/en/make-[email or web address]-referral 4787339680    (Spanish speaking therapists)  [facility]  Psychiatry & counseling; Adults and children;  Call Registration prior to scheduling an appointment [phone] [address]    [place], [place] [address]    [phone]  [facility] (Psychiatry & counseling; adults & children; Medicare no Medicaid)  [address]   [place], [place]  [address]      [phone]    [facility] (up to age 72)  Psychiatry & counseling ,will take Medicaid, must do counseling to receive psychiatry services  [address] [place]  [place] [address]        [phone]  [facility] (Psychiatry & counseling; adults & children; will take Medicaid) Will need a referral from provider [address],  [place], [place]  [phone]   [facility] --- Walk-In Mon-Friday 8am-3pm ( will take Medicaid, Psychiatry, Adults & children,  [address], [place], [place]   [phone]   [facility] [place], Walk-in M-F 8am-12pm and 1pm -3pm   (Counseling, Psychiatry, will take Medicaid, adults & children)  [address], [place], [place]  [phone]

## 2021-01-11 NOTE — Assessment & Plan Note (Signed)
Patient reporting right flank pain in bilateral lower back pain for 4 days. Patient states that she is at decreased urinary output. She reports abdominal bloating. Patient reports no personal history of nephrolithiasis however her parents both have frequent episodes. She has not noticed any hematuria. Likely on differential considering pyelonephritis given fever of 102 reported 4 days ago. Urinalysis consistent with acute UTI. Could also consider nephrolithiasis for amount of reported pain. Concern for urinary retention due to small volume of urine provided for sample and patient's report of low urinary output for the past 4 days. Dr. Manson Passey completed bladder ultrasound with 102mL of urine in bladder. -Urine analysis -CBC -CMP

## 2021-01-12 LAB — CBC WITH DIFFERENTIAL/PLATELET
Basophils Absolute: 0 10*3/uL (ref 0.0–0.2)
Basos: 0 %
EOS (ABSOLUTE): 0.2 10*3/uL (ref 0.0–0.4)
Eos: 2 %
Hematocrit: 46 % (ref 34.0–46.6)
Hemoglobin: 15.5 g/dL (ref 11.1–15.9)
Immature Grans (Abs): 0 10*3/uL (ref 0.0–0.1)
Immature Granulocytes: 0 %
Lymphocytes Absolute: 3.5 10*3/uL — ABNORMAL HIGH (ref 0.7–3.1)
Lymphs: 31 %
MCH: 28.9 pg (ref 26.6–33.0)
MCHC: 33.7 g/dL (ref 31.5–35.7)
MCV: 86 fL (ref 79–97)
Monocytes Absolute: 0.9 10*3/uL (ref 0.1–0.9)
Monocytes: 8 %
Neutrophils Absolute: 6.8 10*3/uL (ref 1.4–7.0)
Neutrophils: 59 %
Platelets: 471 10*3/uL — ABNORMAL HIGH (ref 150–450)
RBC: 5.36 x10E6/uL — ABNORMAL HIGH (ref 3.77–5.28)
RDW: 13.2 % (ref 11.7–15.4)
WBC: 11.4 10*3/uL — ABNORMAL HIGH (ref 3.4–10.8)

## 2021-01-12 LAB — COMPREHENSIVE METABOLIC PANEL
ALT: 16 IU/L (ref 0–32)
AST: 14 IU/L (ref 0–40)
Albumin/Globulin Ratio: 1.9 (ref 1.2–2.2)
Albumin: 4.3 g/dL (ref 3.8–4.8)
Alkaline Phosphatase: 125 IU/L — ABNORMAL HIGH (ref 44–121)
BUN/Creatinine Ratio: 13 (ref 9–23)
BUN: 9 mg/dL (ref 6–20)
Bilirubin Total: 0.3 mg/dL (ref 0.0–1.2)
CO2: 21 mmol/L (ref 20–29)
Calcium: 9.1 mg/dL (ref 8.7–10.2)
Chloride: 103 mmol/L (ref 96–106)
Creatinine, Ser: 0.68 mg/dL (ref 0.57–1.00)
GFR calc Af Amer: 132 mL/min/{1.73_m2} (ref >59)
GFR calc non Af Amer: 114 mL/min/{1.73_m2} (ref >59)
Globulin, Total: 2.3 g/dL (ref 1.5–4.5)
Glucose: 103 mg/dL — ABNORMAL HIGH (ref 65–99)
Potassium: 4.4 mmol/L (ref 3.5–5.2)
Sodium: 137 mmol/L (ref 134–144)
Total Protein: 6.6 g/dL (ref 6.0–8.5)

## 2021-01-13 LAB — URINE CULTURE: Organism ID, Bacteria: NO GROWTH

## 2021-01-18 ENCOUNTER — Ambulatory Visit (INDEPENDENT_AMBULATORY_CARE_PROVIDER_SITE_OTHER): Payer: 59 | Admitting: Family Medicine

## 2021-01-18 ENCOUNTER — Other Ambulatory Visit: Payer: Self-pay

## 2021-01-18 ENCOUNTER — Ambulatory Visit
Admission: RE | Admit: 2021-01-18 | Discharge: 2021-01-18 | Disposition: A | Discharge status: Home / Self Care | Payer: 59 | Source: Ambulatory Visit | Attending: Family Medicine | Admitting: Family Medicine

## 2021-01-18 VITALS — BP 110/60 | HR 86 | Wt 224.2 lb

## 2021-01-18 DIAGNOSIS — R1030 Lower abdominal pain, unspecified: Secondary | ICD-10-CM

## 2021-01-18 DIAGNOSIS — N390 Urinary tract infection, site not specified: Secondary | ICD-10-CM | POA: Diagnosis not present

## 2021-01-18 DIAGNOSIS — R109 Unspecified abdominal pain: Secondary | ICD-10-CM

## 2021-01-18 DIAGNOSIS — N23 Unspecified renal colic: Secondary | ICD-10-CM | POA: Diagnosis not present

## 2021-01-18 LAB — POCT URINALYSIS DIP (CLINITEK)
Bilirubin, UA: NEGATIVE
Glucose, UA: NEGATIVE mg/dL
Ketones, POC UA: NEGATIVE mg/dL
Leukocytes, UA: NEGATIVE
Nitrite, UA: NEGATIVE
POC PROTEIN,UA: NEGATIVE
Spec Grav, UA: >=1.03 — AB (ref 1.010–1.025)
Urobilinogen, UA: 0.2 E.U./dL
pH, UA: 5 (ref 5.0–8.0)

## 2021-01-18 LAB — POCT UA - MICROSCOPIC ONLY

## 2021-01-18 LAB — POCT URINE PREGNANCY: Preg Test, Ur: NEGATIVE

## 2021-01-18 IMAGING — CT CT RENAL STONE PROTOCOL
1 of 2 series · 13 of 32 positions shown, 18 images · non-contrast
Comparison: None.

CLINICAL DATA: Flank pain, kidney stone suspected. Lower abdominal
pain. Patient reports right-sided flank and abdominal pain with
microhematuria for 1 week.

EXAM:
CT ABDOMEN AND PELVIS WITHOUT CONTRAST
TECHNIQUE: Multidetector CT imaging of the abdomen and pelvis was performed
following the standard protocol without IV contrast.

[Series 2: abd/pelvis w/(date) · axial · 0.86mm/px · z∈[-453,-53]mm · 13 of 91 slices shown, 18 images]
[im 6/91  soft-tissue]
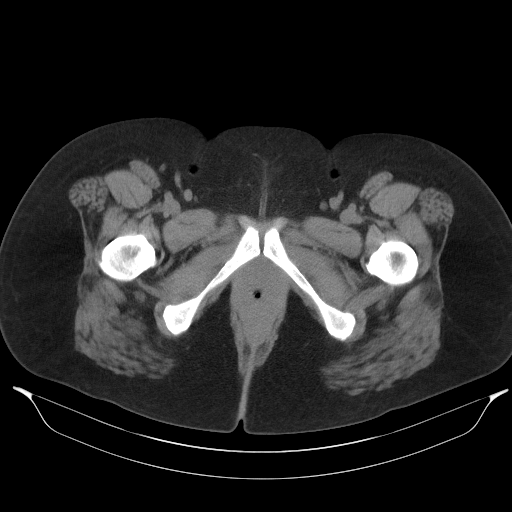
[im 6/91  bone]
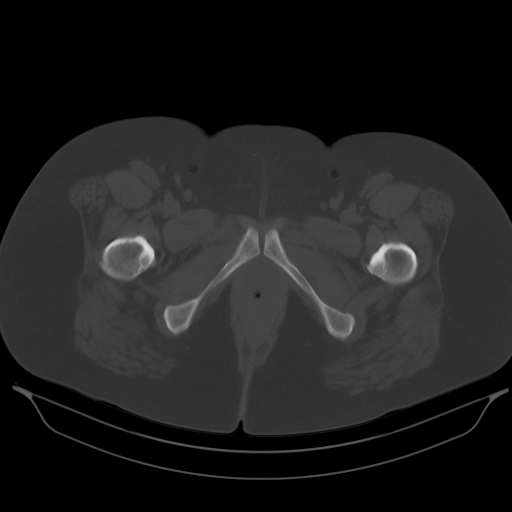
[im 16/91  soft-tissue]
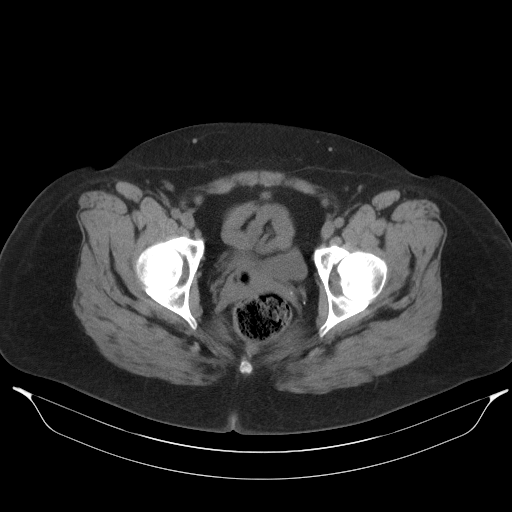
[im 21/91  soft-tissue]
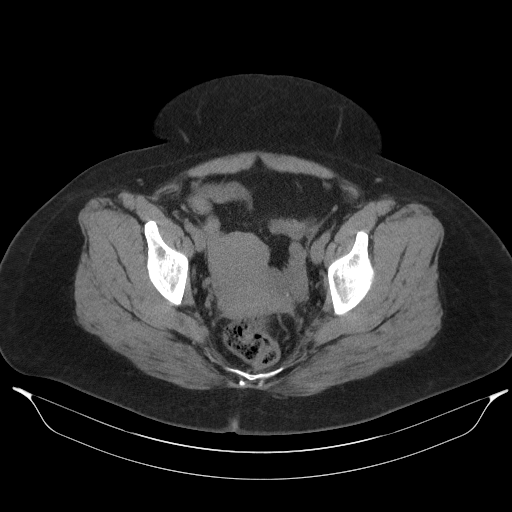
[im 26/91  soft-tissue]
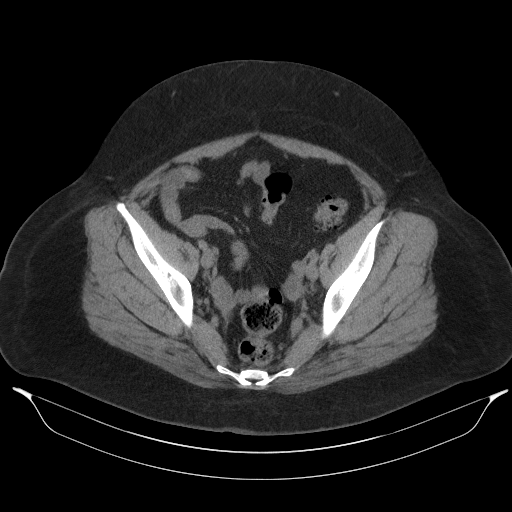
[im 36/91  soft-tissue]
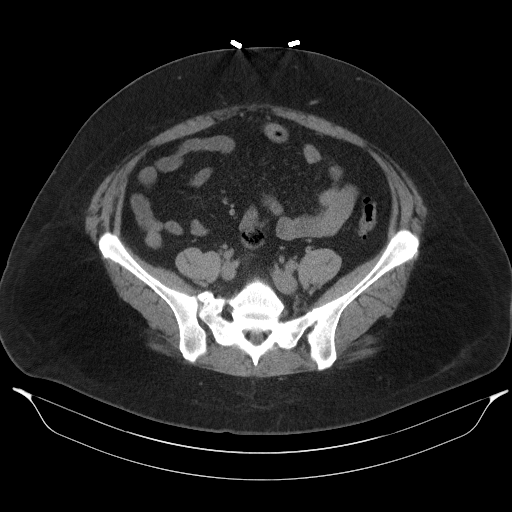
[im 41/91  soft-tissue]
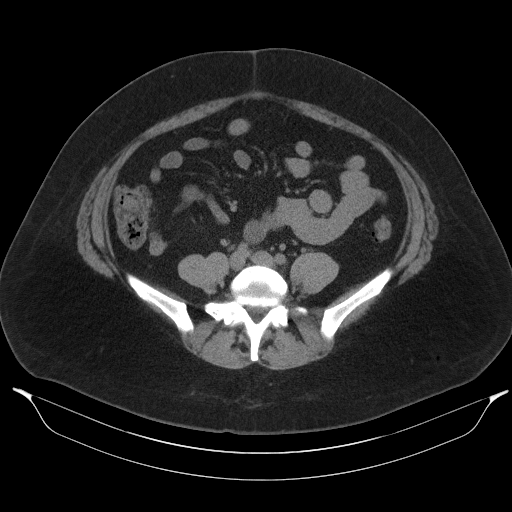
[im 51/91  soft-tissue]
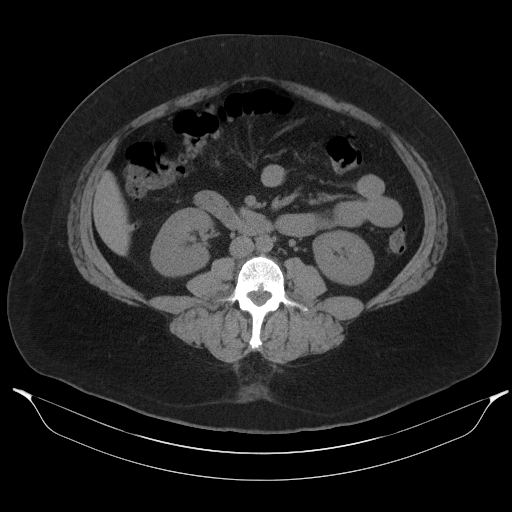
[im 56/91  soft-tissue]
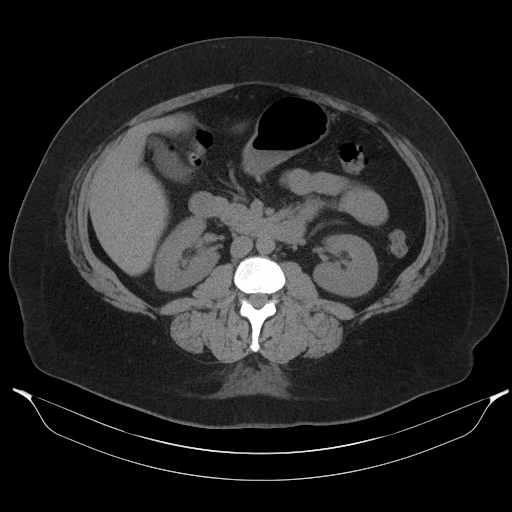
[im 66/91  soft-tissue]
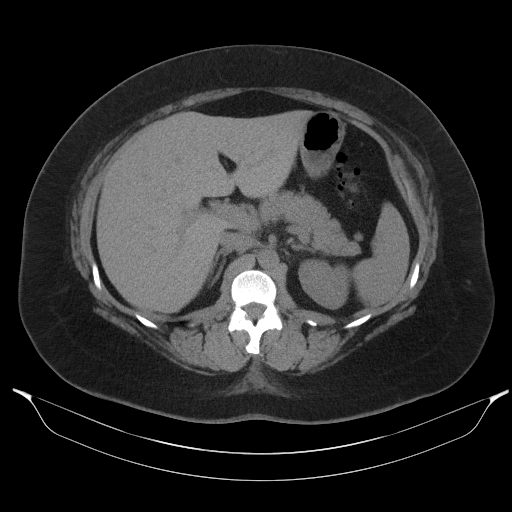
[im 66/91  bone]
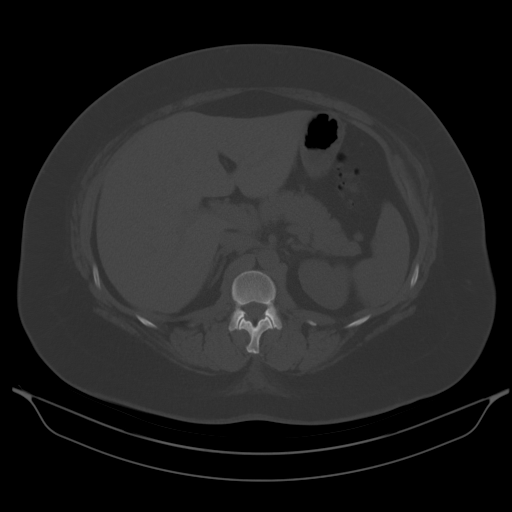
[im 71/91  soft-tissue]
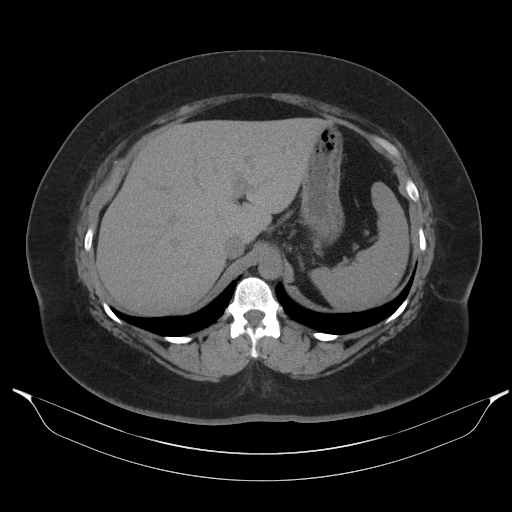
[im 71/91  lung]
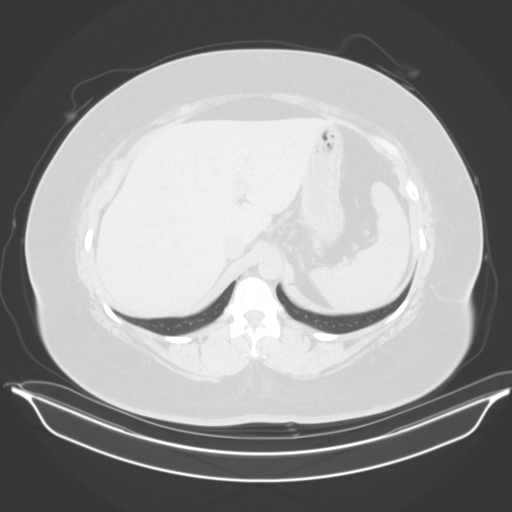
[im 76/91  soft-tissue]
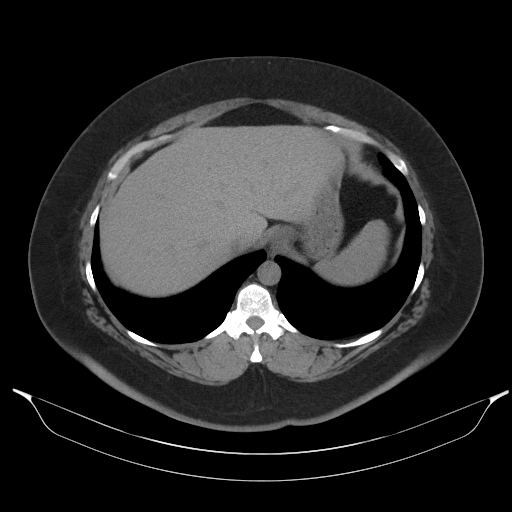
[im 76/91  lung]
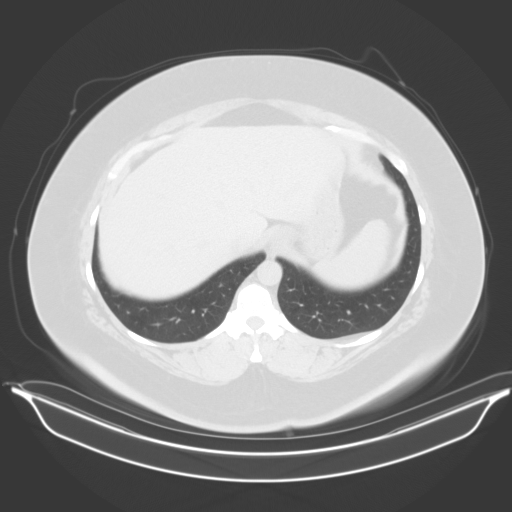
[im 81/91  lung]
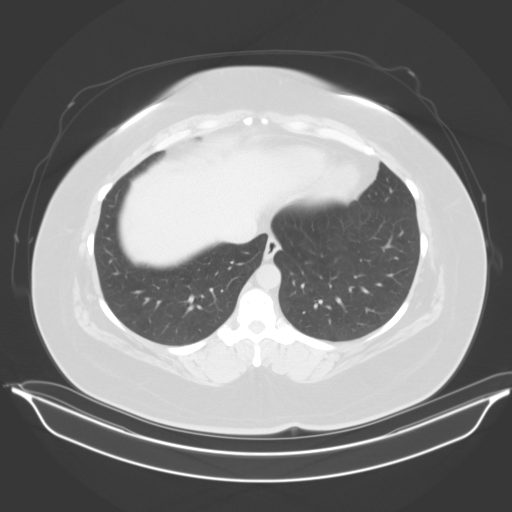
[im 86/91  soft-tissue]
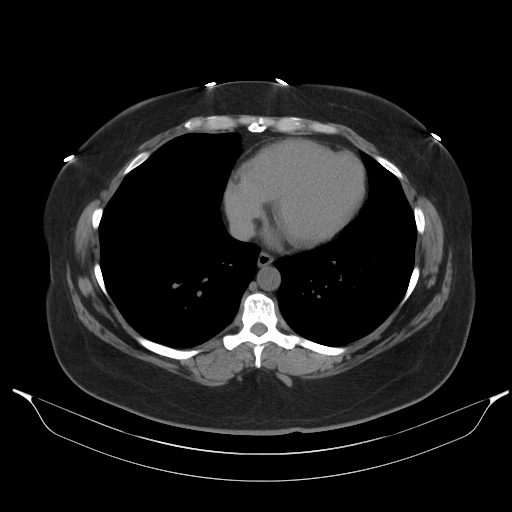
[im 86/91  lung]
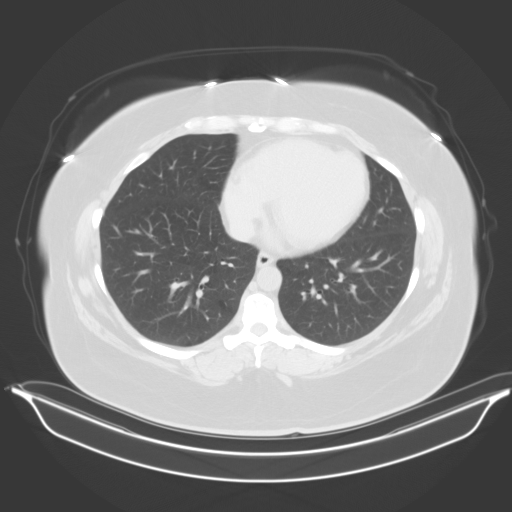

[13 of 32 positions shown; findings below may reference images not displayed]

FINDINGS: Lower chest: The lung bases are clear. No focal airspace disease or
pleural effusion. The heart is normal in size.

Hepatobiliary: No focal liver abnormality is seen. No gallstones,
gallbladder wall thickening, or biliary dilatation.

Pancreas: No ductal dilatation or inflammation. No evidence of
pancreatic mass.

Spleen: Normal in size with occasional calcified granuloma. No
evidence of focal lesion on noncontrast exam.

Adrenals/Urinary Tract: Normal adrenal glands. Kidneys are symmetric
in size. No hydronephrosis. No perinephric edema. No renal calculi.
No evidence of focal renal lesion on noncontrast exam. Both ureters
are decompressed without stones along the course. The urinary
bladder is near completely empty. No bladder stone.

Stomach/Bowel: Decompressed stomach, unremarkable. Normal
positioning of the duodenum and ligament of Treitz. Normal
unenhanced small bowel without obstruction or inflammation. Normal
appendix courses to the right upper quadrant. There is multifocal
colonic diverticulosis extending from the cecum through the sigmoid
colon. Diverticular changes are most prominent distally. No evidence
of diverticulitis, acute colonic inflammation or wall thickening. No
pericolonic edema. Small volume of colonic stool.

Vascular/Lymphatic: Mild aortic atherosclerosis, advanced for age.
No aortic aneurysm. No enlarged lymph nodes in the abdomen or
pelvis.

Reproductive: Tampon in the vagina. Unremarkable uterus. Ovaries are
not well-defined on this noncontrast exam. No gross adnexal mass.

Other: No free fluid or ascites. There is a small fat containing
umbilical hernia. No inguinal hernia.

Musculoskeletal: There are no acute or suspicious osseous
abnormalities. Hemi transitional lumbosacral anatomy with enlarged
transverse process of L5 on the right and pseudoarticulation with
the sacrum.
IMPRESSION: 1. No renal stones or obstructive uropathy. No acute abnormality in
the abdomen/pelvis or explanation for pain.
2. Colonic diverticulosis without acute inflammation. Diverticular
changes are prominent for age.
3. Small fat containing umbilical hernia.
4. Mild, but age advanced, aortic atherosclerosis.

Aortic Atherosclerosis ([UT]-[UT]).

## 2021-01-18 MED ORDER — TAMSULOSIN HCL 0.4 MG PO CAPS: 0.4000 mg | ORAL_CAPSULE | Freq: Every day | ORAL | 0 refills | Status: DC

## 2021-01-18 MED ORDER — KETOROLAC TROMETHAMINE 30 MG/ML IJ SOLN
30.0000 mg | Freq: Once | INTRAMUSCULAR | Status: AC
Start: 2021-01-18 — End: 2021-01-18
  Administered 2021-01-18: 30 mg via INTRAMUSCULAR

## 2021-01-18 NOTE — Patient Instructions (Signed)
Thank you for coming to see me today. It was a pleasure.   We will get some labs today.  If they are abnormal or we need to do something about them, I will call you.  If they are normal, I will send you a message on MyChart (if it is active) or a letter in the mail.  If you don't hear from [place] in 2 weeks, please call the office at the number below.   I have placed an order for CT renal study.  Please go to [facility] [employer] or at [facility] to have this completed.  You do not need an appointment, but if you would like to call them beforehand, their number is [phone].  We will contact you with your results afterwards.   If you develop fevers>100.5, shortness of breath, chest pain, palpitations, dizziness, abdominal pain, nausea, vomiting, diarrhea or cannot eat or drink then please go to the ER immediately.   Please follow-up with PCP in 1 week  If you have any questions or concerns, please do not hesitate to call the office at [phone].  Best,   Dana Allan, MD    Renal Colic  Renal colic is pain that is caused by a kidney stone. The pain can be sharp and very bad. It may be felt in the back, belly, side (flank), or groin. It can cause nausea. Renal colic can come and go. Follow these instructions at home: Medicines  Take over-the-counter and prescription medicines only as told by your doctor.  Do not drive or use heavy machinery while taking prescription pain medicine. Eating and drinking  Drink enough fluid to keep your pee (urine) pale yellow. You may be told to drink at least 8-10 glasses of water each day. Follow instructions from your doctor.  If told, change your diet. This may include eating: ? Less salt (sodium). Eat less than 2 grams (2,000 mg) of salt per day. ? Less meat, poultry, fish, and eggs. ? More fruits and vegetables. ? Try not to eat spinach, rhubarb, sweet potatoes, or nuts.  Follow instructions from your doctor  about what foods and drinks to avoid.   General instructions  Keep all follow-up visits as told by your doctor. This is important.  Collect pee samples as told by your doctor.  Strain your pee every time you pee, as told by your doctor. Use the strainer that your doctor recommends.  Do not throw out the kidney stone after passing it. Keep the stone so it can be tested by your doctor. Contact a doctor if:  You have a fever or chills.  Your pee smells bad or looks cloudy.  You have pain or burning when you pee. Get help right away if:  The pain in your side (flank) or your groin suddenly gets worse.  You get confused.  You pass out. Summary  Renal colic is pain that is caused by a kidney stone.  Take over-the-counter and prescription medicines only as told by your doctor.  Drink enough fluid to keep your pee pale yellow. You may be told to drink at least 8-10 glasses of water each day. Follow instructions from your doctor.  Strain your pee every time you pee, as told by your doctor. Use the strainer that your doctor recommends.  Do not throw out the kidney stone after passing it. Keep the stone so it can be tested by your doctor. This information is not intended to replace  advice given to you by your health care provider. Make sure you discuss any questions you have with your health care provider. Document Revised: 12/04/2017 Document Reviewed: 12/04/2017 Elsevier Patient Education  2021 [employer].

## 2021-01-18 NOTE — Progress Notes (Signed)
    SUBJECTIVE:   CHIEF COMPLAINT / HPI: kidney pain  Flank Pain Patient reports continuing lower back pain related to kidneys.  She was seen in clinic for similar symptoms 02/22 and work up at that time showed mild leukocytosis.  Urine culture was negative.  Since then she reports that the pain has worsened.  Pain starts in lower back and radiates to groin bilaterally.  Denies any recent fevers or chills, although reports fever of 102 at initial visit. Reports dysuria, dysparuenia but no urgency or frequency. Decrease in urine output.  Reports history of STI in her earlier years.  She is currently on her menses and is also having menstrual cramping but reports that this pain is not her typical menstrual cramp.  Also reports history of IBS but again she reports that this is not the same pain she is used to when having a flare. LBM today.  Denies any vomitting, hematemesis, or blood in stool.  Has hematuria now given that she is on menses.    PERTINENT  PMH / PSH:  IBS Tubal Ligation  OBJECTIVE:   BP 110/60   Pulse 86   Wt 224 lb 3.2 oz (101.7 kg)   LMP 01/17/2021   SpO2 96%   BMI 41.01 kg/m    General: Alert, tearful and in discomfort Cardio: Normal S1 and S2, RRR, no r/m/g Pulm: CTAB, normal work of breathing Abdomen: Bowel sounds normal. Abdomen soft and mild diffuse tenderness. CVA tenderness bilaterally.  No peritoneal signs.  ASSESSMENT/PLAN:   Flank pain Unclear etiology.  I suspect nephrolithiasis given bilateral flank pain radiating to groin and positive CVA tenderness.  Could be UTI vs Pyelonephritis CVA tenderness and reported recent fevers.  Given that pain is now worsening cannot rule out possible underlying abscess.  Considered ectopic but less likely given history of tubal.  Also considered ovarian torsion but less likely given pain start posteriorly and no significant pain on abdominal exam.  Could be muscle strain but given no recent trauma and reports of fevers less  likely. -CBC with diff, Cmet today -urine and urine culture today -UPT negative -CT renal stone study -Trial Flomax 0.4mg  daily -Toradol 30 mg IM now -Letter for work. She may return to work 03/01. -Strict return precautions provided -Follow up with PCP in 1 week or sooner if symptoms no better    Dana Allan, MD [facility]

## 2021-01-19 LAB — CBC WITH DIFFERENTIAL/PLATELET
Basophils Absolute: 0.1 10*3/uL (ref 0.0–0.2)
Basos: 1 %
EOS (ABSOLUTE): 0.2 10*3/uL (ref 0.0–0.4)
Eos: 1 %
Hematocrit: 43.7 % (ref 34.0–46.6)
Hemoglobin: 15 g/dL (ref 11.1–15.9)
Immature Grans (Abs): 0.1 10*3/uL (ref 0.0–0.1)
Immature Granulocytes: 1 %
Lymphocytes Absolute: 4.8 10*3/uL — ABNORMAL HIGH (ref 0.7–3.1)
Lymphs: 35 %
MCH: 29.6 pg (ref 26.6–33.0)
MCHC: 34.3 g/dL (ref 31.5–35.7)
MCV: 86 fL (ref 79–97)
Monocytes Absolute: 1.1 10*3/uL — ABNORMAL HIGH (ref 0.1–0.9)
Monocytes: 8 %
Neutrophils Absolute: 7.4 10*3/uL — ABNORMAL HIGH (ref 1.4–7.0)
Neutrophils: 54 %
Platelets: 462 10*3/uL — ABNORMAL HIGH (ref 150–450)
RBC: 5.06 x10E6/uL (ref 3.77–5.28)
RDW: 12.7 % (ref 11.7–15.4)
WBC: 13.6 10*3/uL — ABNORMAL HIGH (ref 3.4–10.8)

## 2021-01-19 LAB — COMPREHENSIVE METABOLIC PANEL
ALT: 22 IU/L (ref 0–32)
AST: 25 IU/L (ref 0–40)
Albumin/Globulin Ratio: 2 (ref 1.2–2.2)
Albumin: 4.1 g/dL (ref 3.8–4.8)
Alkaline Phosphatase: 138 IU/L — ABNORMAL HIGH (ref 44–121)
BUN/Creatinine Ratio: 9 (ref 9–23)
BUN: 6 mg/dL (ref 6–20)
Bilirubin Total: <0.2 mg/dL (ref 0.0–1.2)
CO2: 20 mmol/L (ref 20–29)
Calcium: 8.6 mg/dL — ABNORMAL LOW (ref 8.7–10.2)
Chloride: 105 mmol/L (ref 96–106)
Creatinine, Ser: 0.64 mg/dL (ref 0.57–1.00)
Globulin, Total: 2.1 g/dL (ref 1.5–4.5)
Glucose: 120 mg/dL — ABNORMAL HIGH (ref 65–99)
Potassium: 4.5 mmol/L (ref 3.5–5.2)
Sodium: 139 mmol/L (ref 134–144)
Total Protein: 6.2 g/dL (ref 6.0–8.5)
eGFR: 119 mL/min/{1.73_m2} (ref >59)

## 2021-01-20 ENCOUNTER — Telehealth: Payer: Self-pay | Admitting: Family Medicine

## 2021-01-20 ENCOUNTER — Encounter: Payer: Self-pay | Admitting: Family Medicine

## 2021-01-20 NOTE — Telephone Encounter (Signed)
Called patient to discuss results of CT renal study.  She reports feeling a little better.  Pain decreasing and no fevers.  Advised to stop Flomax and schedule an appointment with PCP next week for follow up.  Dana Allan, MD [facility]

## 2021-01-20 NOTE — Assessment & Plan Note (Addendum)
Unclear etiology.  I suspect nephrolithiasis given bilateral flank pain radiating to groin and positive CVA tenderness.  Could be UTI vs Pyelonephritis CVA tenderness and reported recent fevers.  Given that pain is now worsening cannot rule out possible underlying abscess.  Considered ectopic but less likely given history of tubal.  Also considered ovarian torsion but less likely given pain start posteriorly and no significant pain on abdominal exam.  Could be muscle strain but given no recent trauma and reports of fevers less likely. -CBC with diff, Cmet today -urine and urine culture today -UPT negative -CT renal stone study -Trial Flomax 0.4mg  daily -Toradol 30 mg IM now -Letter for work. She may return to work 03/01. -Strict return precautions provided -Follow up with PCP in 1 week or sooner if symptoms no better

## 2021-02-01 ENCOUNTER — Telehealth: Payer: Self-pay | Admitting: Family Medicine

## 2021-02-01 NOTE — Telephone Encounter (Signed)
Patient is calling and would like to know if she can have a new letter written and sent to her mychart. She saw Dr. Neita Garnet on 01/11/21. She wrote a letter excusing her from work that day but her employer is stating he needs the letter to state that she was also excused from work on 01/09/21 and 01/10/21.   She would like this sent to her mychart.

## 2021-02-02 ENCOUNTER — Encounter: Payer: Self-pay | Admitting: Family Medicine

## 2021-02-02 NOTE — Progress Notes (Signed)
16, 2022   Patient: Maria Deleon  Date of Birth: October 29, 1986  Date of Visit: 02/02/2021    Work letter updated to include dates of illness and sent to patient via MyChart as requested.  Please see below for copy if needed in future.   To Whom it May Concern:  Dezzie Badilla was seen in my clinic on 01/11/21 for an illness that affected her ability to work on 01/09/21 and 01/10/21. Please excuse her absences due to her illness for which she was evaluated in my clinic as well as her clinic appointment.    Sincerely,     Ronnald Ramp, MD

## 2021-07-26 ENCOUNTER — Telehealth: Payer: Self-pay

## 2021-07-26 NOTE — Telephone Encounter (Signed)
Patient calls nurse line requesting to schedule appointment for chest pain. Patient reports that she is having sharp left sided chest pain.   Patient reports that this has been going on for a month or two. However, reports that it has been worse today. Reports pain is at 6/10. Reports that pain is worse with inspiration.   Denies radiation of pain.   Advised that due to worsening sharp chest pain and inspiratory chest pain that patient should be evaluated in the ED. Patient verbalizes understanding.   Veronda Prude, RN

## 2021-08-17 ENCOUNTER — Ambulatory Visit: Payer: 59 | Admitting: Family Medicine

## 2021-08-17 NOTE — Progress Notes (Deleted)
     SUBJECTIVE:   CHIEF COMPLAINT / HPI:   Maria Deleon is a 35 y.o. female presents for hospital follow up      ***  Flowsheet Row Office Visit from 01/18/2021 in [facility]  PHQ-9 Total Score 7        Health Maintenance Due  Topic   COVID-19 Vaccine (1)   INFLUENZA VACCINE       PERTINENT  PMH / PSH:   OBJECTIVE:   There were no vitals taken for this visit.   General: Alert, no acute distress Cardio: Normal S1 and S2, RRR, no r/m/g Pulm: CTAB, normal work of breathing Abdomen: Bowel sounds normal. Abdomen soft and non-tender.  Extremities: No peripheral edema.  Neuro: Cranial nerves grossly intact   ASSESSMENT/PLAN:   No problem-specific Assessment & Plan notes found for this encounter.    Towanda Octave, MD PGY-3 [facility]

## 2021-08-24 ENCOUNTER — Ambulatory Visit (INDEPENDENT_AMBULATORY_CARE_PROVIDER_SITE_OTHER): Payer: 59 | Admitting: Family Medicine

## 2021-08-24 DIAGNOSIS — Z91199 Patient's noncompliance with other medical treatment and regimen due to unspecified reason: Secondary | ICD-10-CM

## 2021-08-24 NOTE — Progress Notes (Deleted)
    SUBJECTIVE:   CHIEF COMPLAINT / HPI:   Hospital follow up - hospitalized 9/19-24 35 year old female with past medical history of diverticulosis, obesity admitted on 08/08/2021 with complaint of left lower quadrant abdominal pain for 2 days was found to have diverticulitis of distal descending colon with focal contained microperforation on CT abdomen pelvis. She also had leukocytosis of 22 on admission. She was started on ciprofloxacin and metronidazole IV and she was kept n.p.o. and general surgery was consulted.     Acute diverticulitis with microperforation;  Treated with IV Cipro and Flagyl 5 days, supportive care with IV fluids and as needed IV analgesia for pain control. Initially she was n.p.o. but once patient pain is improved and she has remained afebrile and leukocytosis trended down, general surgery has started her on clear liquid diet on 08/10/2021, diet was advanced to full liquid and low residue which she tolerated very well. She was ambulating without difficulty and her pain was resolved. He was also tolerating p.o. diet at that point she was discharged home on oral Cipro and Flagyl for 9 more days to complete 14 days of treatment. General surgery also cleared her for discharge. She was also provided referral for GI for outpatient colonoscopy.Advanced to low residue diet, which she should stay on for a couple of weeks before transitioning to a high fiber diet.   PERTINENT  PMH / PSH: ***  OBJECTIVE:   There were no vitals taken for this visit.  ***  ASSESSMENT/PLAN:   No problem-specific Assessment & Plan notes found for this encounter.     Fayette Pho, MD [facility]

## 2021-09-02 NOTE — Progress Notes (Signed)
Patient no showed

## 2021-09-13 ENCOUNTER — Ambulatory Visit (INDEPENDENT_AMBULATORY_CARE_PROVIDER_SITE_OTHER): Payer: 59 | Admitting: Podiatry

## 2021-09-13 ENCOUNTER — Encounter: Payer: Self-pay | Admitting: Podiatry

## 2021-09-13 ENCOUNTER — Ambulatory Visit (INDEPENDENT_AMBULATORY_CARE_PROVIDER_SITE_OTHER): Payer: 59

## 2021-09-13 ENCOUNTER — Other Ambulatory Visit: Payer: Self-pay

## 2021-09-13 DIAGNOSIS — G6289 Other specified polyneuropathies: Secondary | ICD-10-CM | POA: Diagnosis not present

## 2021-09-13 DIAGNOSIS — M722 Plantar fascial fibromatosis: Secondary | ICD-10-CM

## 2021-09-13 DIAGNOSIS — M5417 Radiculopathy, lumbosacral region: Secondary | ICD-10-CM

## 2021-09-13 DIAGNOSIS — M79671 Pain in right foot: Secondary | ICD-10-CM

## 2021-09-13 DIAGNOSIS — G5753 Tarsal tunnel syndrome, bilateral lower limbs: Secondary | ICD-10-CM

## 2021-09-13 DIAGNOSIS — M79672 Pain in left foot: Secondary | ICD-10-CM

## 2021-09-13 NOTE — Patient Instructions (Signed)

## 2021-09-14 ENCOUNTER — Ambulatory Visit (INDEPENDENT_AMBULATORY_CARE_PROVIDER_SITE_OTHER): Payer: 59 | Admitting: Family Medicine

## 2021-09-14 ENCOUNTER — Encounter: Payer: Self-pay | Admitting: Family Medicine

## 2021-09-14 ENCOUNTER — Other Ambulatory Visit: Payer: Self-pay

## 2021-09-14 VITALS — BP 131/87 | HR 72 | Ht 62.0 in | Wt 226.0 lb

## 2021-09-14 DIAGNOSIS — F339 Major depressive disorder, recurrent, unspecified: Secondary | ICD-10-CM

## 2021-09-14 DIAGNOSIS — K5792 Diverticulitis of intestine, part unspecified, without perforation or abscess without bleeding: Secondary | ICD-10-CM

## 2021-09-14 DIAGNOSIS — Z23 Encounter for immunization: Secondary | ICD-10-CM | POA: Diagnosis not present

## 2021-09-14 MED ORDER — POLYETHYLENE GLYCOL 3350 17 G PO PACK: 17.0000 g | PACK | Freq: Every day | ORAL | 0 refills | Status: DC

## 2021-09-14 MED ORDER — SERTRALINE HCL 50 MG PO TABS: 50.0000 mg | ORAL_TABLET | Freq: Every day | ORAL | 0 refills | Status: DC

## 2021-09-14 NOTE — Progress Notes (Signed)
    SUBJECTIVE:   CHIEF COMPLAINT / HPI:   Diverticulitis f/u  Patient was recently hospitalized for diverticulitis.  Imaging results confirm the diagnosis.  She completed her antibiotics and reports that her symptoms have greatly improved but she is still having issues with intermittent constipation followed by episodes of diarrhea.  She reports that her hemorrhoids are bothering her and she occasionally sees streaks of blood on her stool.  She reports that when she has constipation her stools are extremely hard.  She has not taking any medications for this.  Reports the pain is usually in her left lower quadrant.  She is scheduled to see gastroenterology and scheduled for colonoscopy.  Denies any fever or chills, vomiting, shortness of breath or chest pain.  Depression Patient reports that her depression is not doing well this time.  She feels overwhelmed because of work and pain.  She feels like she is not able to provide for family.  PHQ-9 today was 22.  She marked to on question #9.  She reports she does not have a plan but just feels like she and everyone else would be better off without her around.  Reports that she was taking her Zoloft and noticed a difference but ran out and never refilled the prescription so has not been taking it recently.  Knows about emergency resources in [place] and is interested in talking to psychiatry.   PERTINENT  PMH / PSH: Recent diverticulitis  OBJECTIVE:   BP 131/87   Pulse 72   Ht 5\' 2"  (1.575 m)   Wt 226 lb (102.5 kg)   LMP 09/11/2021 (Approximate)   BMI 41.34 kg/m   General: Well-appearing 35 year old female in no acute distress Cardiac: Regular rate and rhythm Respiratory: Normal work of breathing, speaking in full sentences Abdomen: Soft, mild tenderness to the left lower quadrant, normoactive bowel sounds Psych: Appears down.  No active SI or HI.  Thoughts that she would be better off not around but no plan.  PHQ9 SCORE ONLY 09/14/2021  01/18/2021 01/11/2021  PHQ-9 Total Score 22 7 10       ASSESSMENT/PLAN:   Depression, recurrent (HCC) Patient currently not doing well.  Not taking Zoloft because ran out.  2 on question 9 of PHQ-9.  No active SI.  Refilled patient's Zoloft.  Provided patient with resources for emergency care, suicide hotline.  Going to send message to therapist regarding scheduling an appointment.  Follow-up scheduled with this patient on 11/18.  Diverticulitis Improved from when she was evaluated in the hospital.  Still having issues with constipation and diarrhea.  Is not taking any current medications.  Scheduled for follow-up with gastroenterology as well as colonoscopy.  Recommended patient try a trial of MiraLAX to help get regular bowel movements.  Patient will try this.  Strict ED and return precautions given.     , MD [facility]

## 2021-09-14 NOTE — Patient Instructions (Addendum)
It was wonderful seeing you today.  I am sorry you are still having abdominal pain and having this intermittent constipation and diarrhea.  I want to try and get your bowel movements regular and free to try MiraLAX daily.  My goal is to get a soft stool.  If you are having watery stools regularly discontinue the MiraLAX.  If you get worse abdominal pain please seek medical attention immediately.  I want you to be sure to follow-up with your GI doctor.  If you have any questions or concerns call the clinic.  I have also attached some information on sitz bath's and hemorrhoids.  I hope you have a wonderful afternoon!  If you are feeling suicidal or depression symptoms worsen please immediately go to:   If you are thinking about harming yourself or having thoughts of suicide, or if you know someone who is, seek help right away. If you are in crisis, make sure you are not left alone.  If someone else is in crisis, make sure he/she/they is not left alone  Call 988 OR 1-800-273-TALK  24 Hour Availability for Walk-IN services  [facility]  [address] [place], [place] [facility] [phone] Crisis [phone]    Other crisis resources:  [facility] [employer] (Domestic Violence, Rape & Victim Assistance [phone]  [facility] [employer] [facility]    (ONLY from 8am-4pm)    [phone]  [facility] Mobile Crisis Unit (24/7)   [phone]  [place] National Suicide Hotline   [phone] Len Childs)   Psychiatry Resource List (Adults and Children) Most of these providers will take Medicaid. please consult your insurance for a complete and updated list of available providers. When calling to make an appointment have your insurance information available to confirm you are covered.   [facility]:Psychiatry and Counseling [address] [place], [place] [address] [phone]  [facility]  [address] [place], [place] [phone] Crisis [phone]   [facility]:   [facility]: [address]     [phone]   [facility]: [address] [place]. [place],        [phone] [place]: [address],    [phone] Kathryne Sharper: Darnelle Going [address],                   [phone] Children: [facility] [address]         [phone]   [facility]  (Psychiatry only; Adults /children 12 and over, will take Medicaid)  [address] Laurell Josephs [address], [place], [place] [address]       [phone]   [facility] (Psychiatry & counseling ; adults & children ; will take Medicaid [address]  [place] [address]   Go on-line to complete referral ( https://www.[email or web address].org/en/make-[email or web address]-referral [phone]   (Spanish therapist)  [facility]  Psychiatry & counseling; Adults and children;  Call Registration prior to scheduling an appointment [phone] [address]    [place], [place] [address]    [phone]  [facility] (Psychiatry & counseling; adults & children; Medicare no Medicaid)  [address]   [place], [place]  [address]      [phone]    [facility] (up to age 59)  Psychiatry & counseling ,will take Medicaid, must do counseling to receive psychiatry services  [address] [place] [address]        [phone]  [facility] (Psychiatry & counseling; adults & children; will take Medicaid) Will need a referral from provider [address],  [place], [place]  [phone]   [facility] --- Walk-In Mon-Friday 8am-3pm ( will take Medicaid, Psychiatry, Adults & children,  [address] [place], [place]   [phone]   [facility] [place]--, Walk-in M-F 8am-12pm and 1pm -3pm   (Counseling, Psychiatry, will take Medicaid, adults &  children)  [address] [place], [place]  [phone]

## 2021-09-15 ENCOUNTER — Telehealth: Payer: Self-pay | Admitting: Psychology

## 2021-09-15 DIAGNOSIS — K5792 Diverticulitis of intestine, part unspecified, without perforation or abscess without bleeding: Secondary | ICD-10-CM | POA: Insufficient documentation

## 2021-09-15 NOTE — Assessment & Plan Note (Signed)
Improved from when she was evaluated in the hospital.  Still having issues with constipation and diarrhea.  Is not taking any current medications.  Scheduled for follow-up with gastroenterology as well as colonoscopy.  Recommended patient try a trial of MiraLAX to help get regular bowel movements.  Patient will try this.  Strict ED and return precautions given.

## 2021-09-15 NOTE — Telephone Encounter (Signed)
Called pt to schedule BH appt. Scheduled for 11/11 at 9am.  Royetta Asal, PhD., LMFT

## 2021-09-15 NOTE — Assessment & Plan Note (Signed)
Patient currently not doing well.  Not taking Zoloft because ran out.  2 on question 9 of PHQ-9.  No active SI.  Refilled patient's Zoloft.  Provided patient with resources for emergency care, suicide hotline.  Going to send message to therapist regarding scheduling an appointment.  Follow-up scheduled with this patient on 11/18.

## 2021-09-19 NOTE — Progress Notes (Signed)
  Subjective:  Patient ID: Maria Deleon, female    DOB: 1986-08-15,  MRN: [id number]  Chief Complaint  Patient presents with   Plantar Fasciitis      plantar fasciitis pain    35 y.o. female presents with the above complaint. History confirmed with patient.  She has bilateral heel pain she is diagnosed with plantar fasciitis about 6 or 7 years ago.  Very difficult to walk on and work on.  Objective:  Physical Exam: warm, good capillary refill, no trophic changes or ulcerative lesions, normal DP and PT pulses, and normal sensory exam.  Bilaterally she has sharp pain in the heels that radiates along the tarsal tunnel she also is a positive Tinel's sign here as well.  Pain with straight leg raise test.   Radiographs: Multiple views x-ray of both feet: no fracture, dislocation, swelling or degenerative changes noted and plantar calcaneal spur Assessment:   1. Plantar fasciitis   2. Lumbosacral radiculopathy   3. Other polyneuropathy      Plan:  Patient was evaluated and treated and all questions answered.  Reviewed the radiographs and clinical exam findings with her.  I think it is possible that some of this is Planter fasciitis but more for symptoms seem to be neurologic in nature.  I am ordering bilateral heel MRIs to evaluate for tarsal tunnel syndrome versus plantar fasciitis to guide treatment.  This been ongoing for several years now.  Has not improved despite prior treatment with other doctors.  Also referring her to [facility] for evaluation estimates appears to be radicular in nature.   Return for after MRI to review .

## 2021-09-30 ENCOUNTER — Ambulatory Visit: Payer: 59 | Admitting: Psychology

## 2021-10-07 ENCOUNTER — Ambulatory Visit
Admission: RE | Admit: 2021-10-07 | Discharge: 2021-10-07 | Disposition: A | Discharge status: Home / Self Care | Payer: 59 | Source: Ambulatory Visit | Attending: Podiatry | Admitting: Podiatry

## 2021-10-07 ENCOUNTER — Other Ambulatory Visit: Payer: 59

## 2021-10-07 ENCOUNTER — Ambulatory Visit (INDEPENDENT_AMBULATORY_CARE_PROVIDER_SITE_OTHER): Payer: 59 | Admitting: Psychology

## 2021-10-07 ENCOUNTER — Ambulatory Visit (INDEPENDENT_AMBULATORY_CARE_PROVIDER_SITE_OTHER): Payer: 59 | Admitting: Family Medicine

## 2021-10-07 ENCOUNTER — Other Ambulatory Visit: Payer: Self-pay

## 2021-10-07 DIAGNOSIS — F339 Major depressive disorder, recurrent, unspecified: Secondary | ICD-10-CM

## 2021-10-07 DIAGNOSIS — G5753 Tarsal tunnel syndrome, bilateral lower limbs: Secondary | ICD-10-CM

## 2021-10-07 DIAGNOSIS — M722 Plantar fascial fibromatosis: Secondary | ICD-10-CM

## 2021-10-07 IMAGING — MR MR HEEL *R* W/O CM
5 series · 38 of 40 positions shown · non-contrast
Comparison: X-ray [DATE]

CLINICAL DATA: Foot pain, chronic, plantar fasciitis suspected Foot
pain, chronic, tarsal tunnel syndrome suspected

EXAM:
MR OF THE RIGHT HEEL WITHOUT CONTRAST
TECHNIQUE: Multiplanar, multisequence MR imaging of the right heel was
performed. No intravenous contrast was administered.

[Series 4: T2 fat-sat · axial · 3.0mm · 0.50mm/px · z∈[-93,+28]mm · 9 of 32 slices shown (1 of 2)]
[im 1/32]
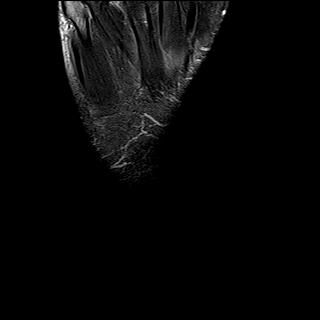
[im 4/32]
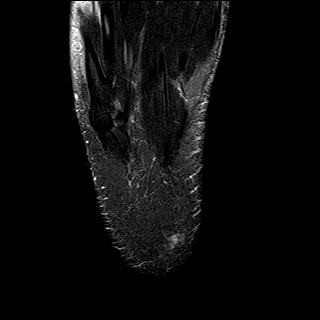
[im 8/32]
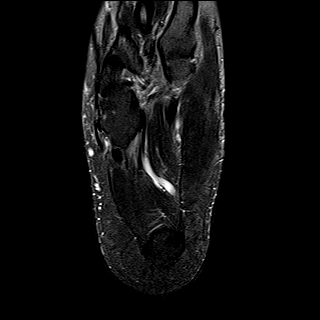
[im 12/32]
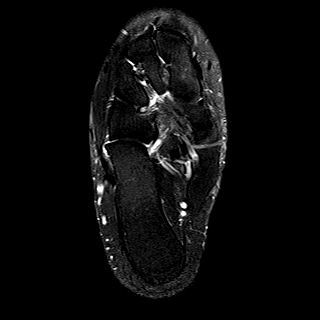
[im 16/32]
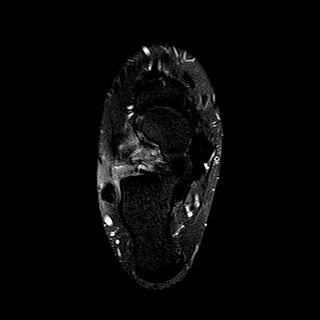
[im 20/32]
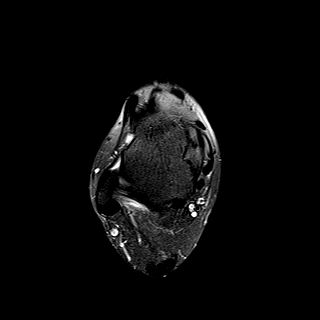
[im 24/32]
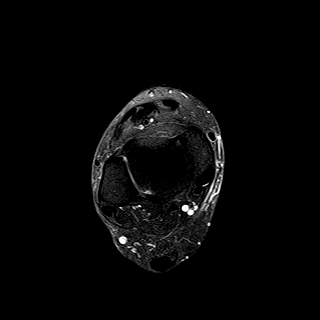
[im 28/32]
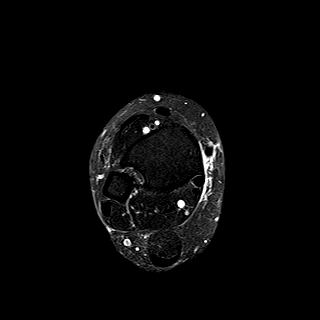
[im 32/32]
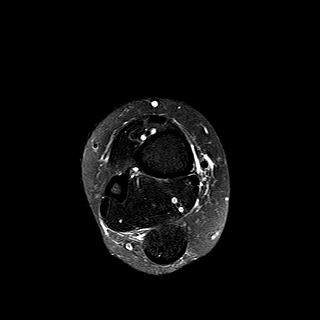

[Series 5: PD fat-sat · axial · 3.0mm · 0.50mm/px · z∈[-93,+28]mm · 9 of 32 slices shown]
[im 1/32]
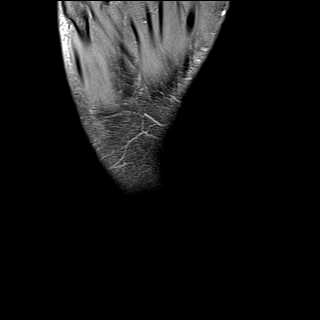
[im 4/32]
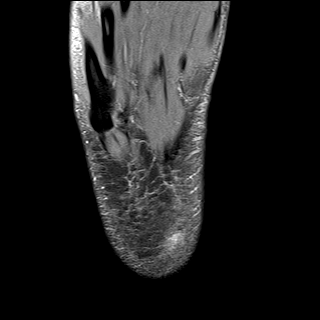
[im 8/32]
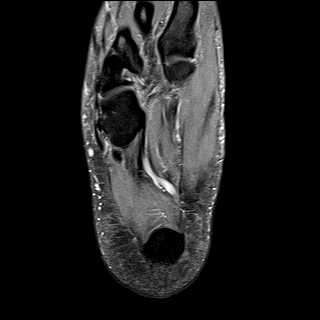
[im 12/32]
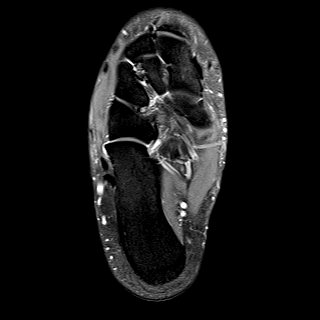
[im 16/32]
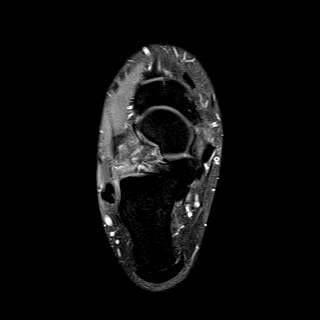
[im 20/32]
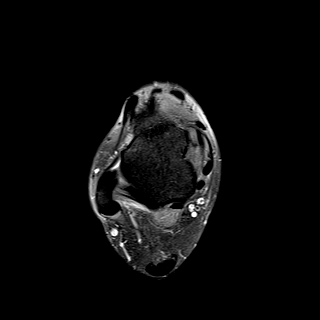
[im 24/32]
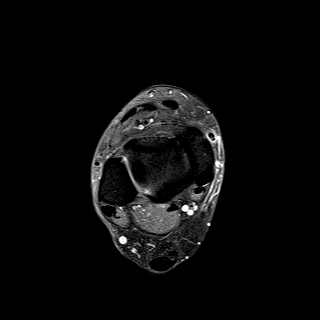
[im 28/32]
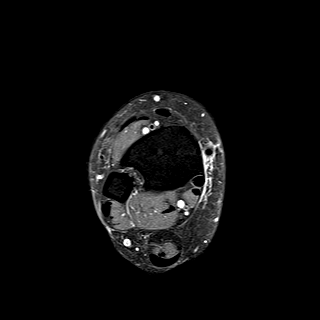
[im 32/32]
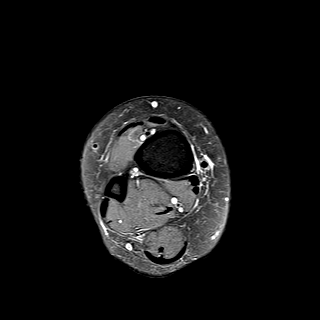

[Series 6: T1 · sagittal · 4.0mm · 0.56mm/px · 6 of 20 slices shown]
[im 1/20]
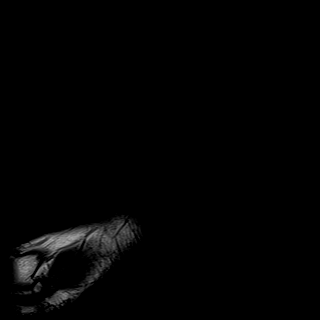
[im 4/20]
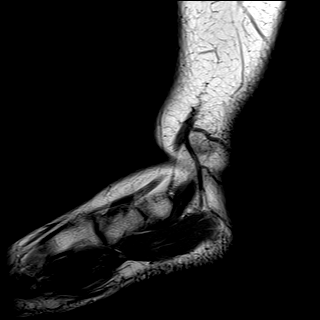
[im 8/20]
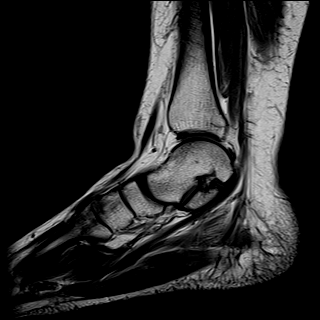
[im 12/20]
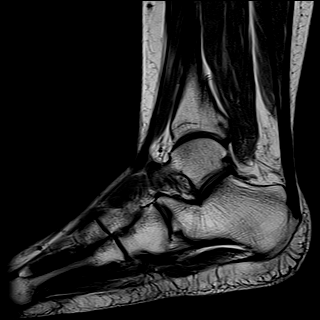
[im 16/20]
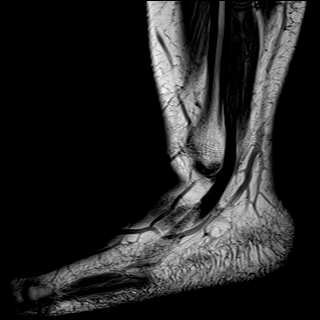
[im 20/20]
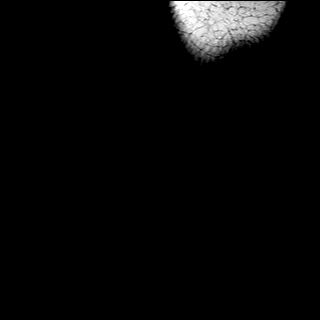

[Series 7: STIR · sagittal · 4.0mm · 0.35mm/px · 6 of 20 slices shown]
[im 1/20]
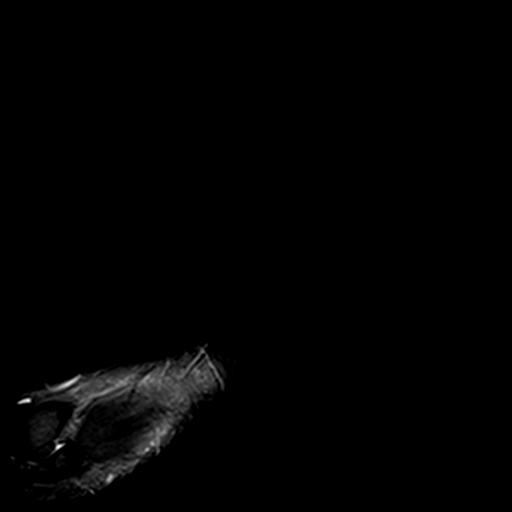
[im 4/20]
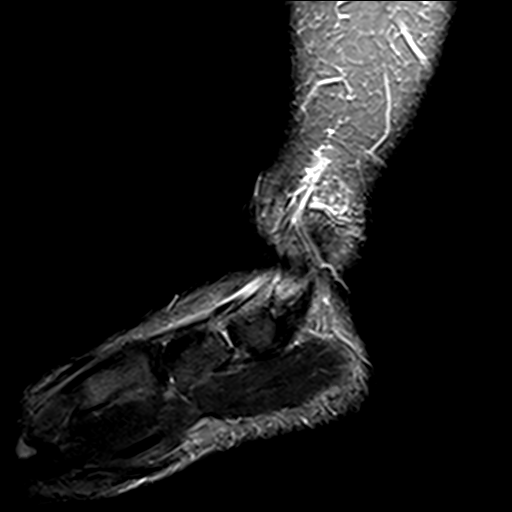
[im 8/20]
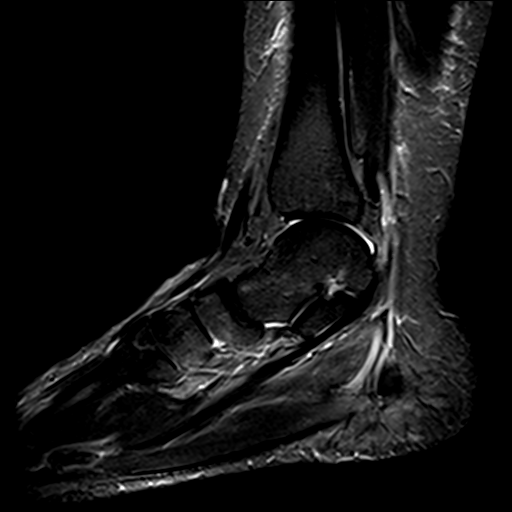
[im 12/20]
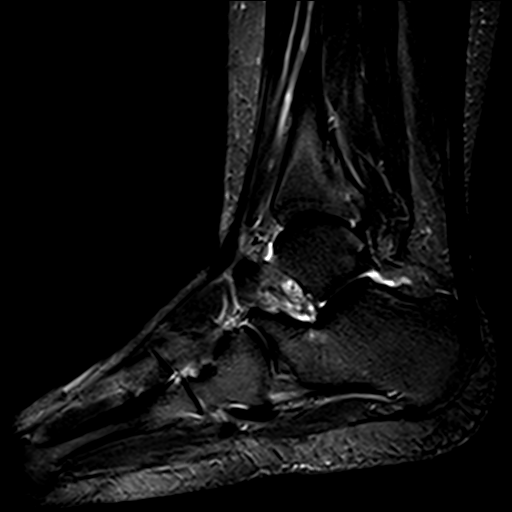
[im 16/20]
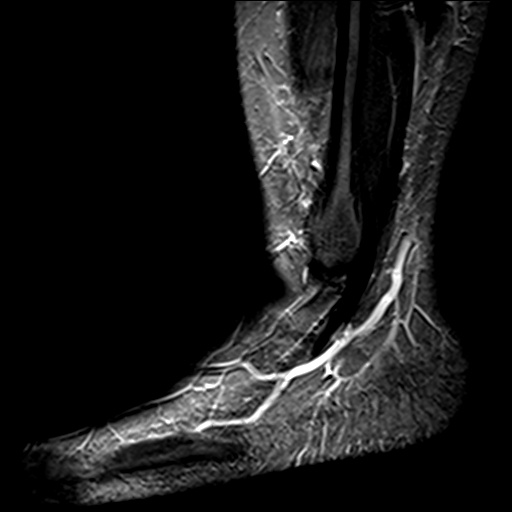
[im 20/20]
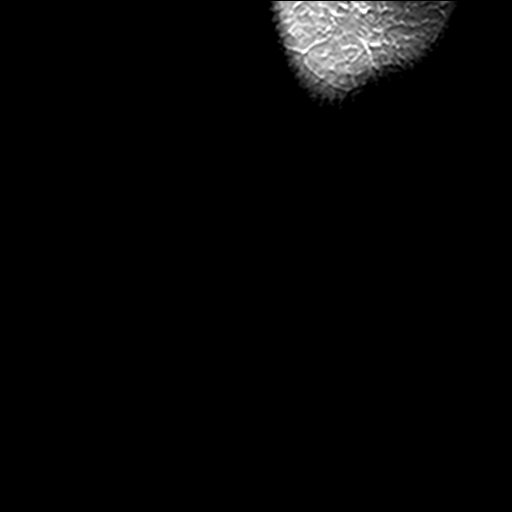

[Series 8: T2 fat-sat · coronal · 3.0mm · 0.50mm/px · 8 of 35 slices shown (2 of 2)]
[im 1/35]
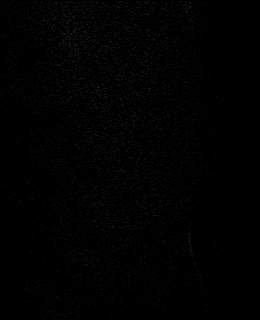
[im 4/35]
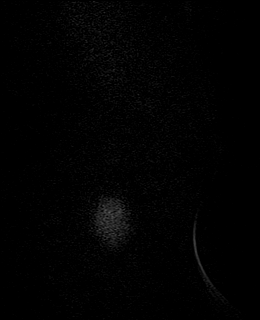
[im 12/35]
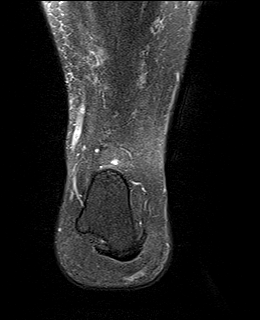
[im 16/35]
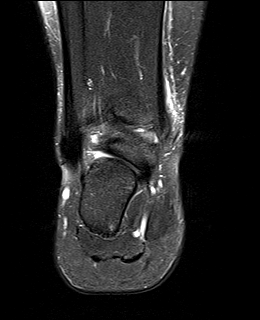
[im 19/35]
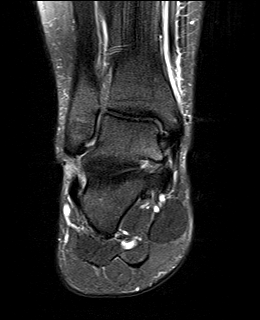
[im 23/35]
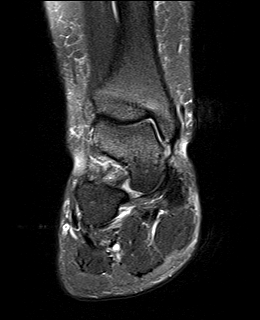
[im 31/35]
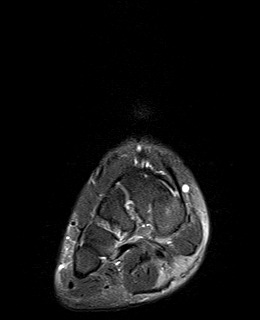
[im 35/35]
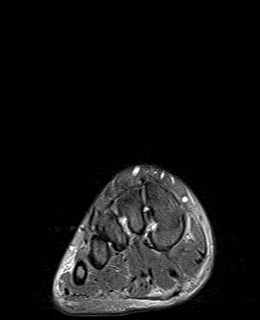

[38 of 40 positions shown; findings below may reference images not displayed]

FINDINGS: TENDONS

Peroneal: Intact peroneus longus and peroneus brevis tendons.

Posteromedial: Intact tibialis posterior, flexor hallucis longus and
flexor digitorum longus tendons.

Anterior: Intact tibialis anterior, extensor hallucis longus and
extensor digitorum longus tendons.

Achilles: Intact.

Plantar Fascia: Intact. No plantar fascial thickening or tear. No
perifascial edema.

LIGAMENTS

Lateral: The anterior and posterior tibiofibular ligaments are
intact. The anterior and posterior talofibular ligaments are intact.
Intact calcaneofibular ligament.

Medial: Deltoid ligament and spring ligament complex intact.

CARTILAGE

Ankle Joint: No joint effusion or chondral defect.

Subtalar Joints/Sinus Tarsi: No joint effusion or chondral defect.
Preservation of the anatomic fat within the sinus tarsi.

Bones: No acute fracture. No malalignment. No bone marrow edema.
Small bidirectional calcaneal enthesophytes.

Other: No abnormality within the tarsal tunnel.
IMPRESSION: 1. Essentially unremarkable MRI of the right ankle. No evidence of
plantar fasciitis. No findings to suggest tarsal tunnel syndrome.
2. Small bidirectional calcaneal enthesophytes.

## 2021-10-07 IMAGING — MR MR HEEL *L* W/O CM
5 series · 36 of 40 positions shown · non-contrast
Comparison: X-ray [DATE]

CLINICAL DATA: Foot pain, chronic, plantar fasciitis suspected Foot
pain, chronic, tarsal tunnel syndrome suspected

EXAM:
MR OF THE LEFT HEEL WITHOUT CONTRAST
TECHNIQUE: Multiplanar, multisequence MR imaging of the left heel was
performed. No intravenous contrast was administered.

[Series 4: T2 fat-sat · axial · 3.0mm · 0.50mm/px · z∈[-95,+26]mm · 9 of 32 slices shown (1 of 2)]
[im 1/32]
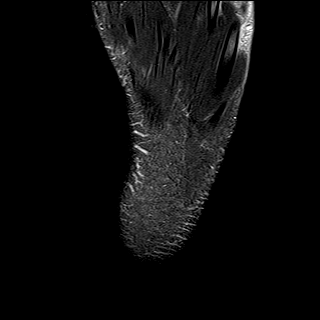
[im 4/32]
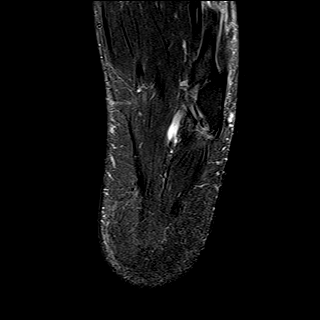
[im 8/32]
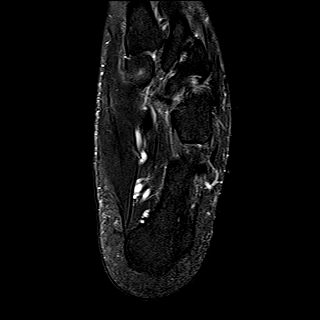
[im 12/32]
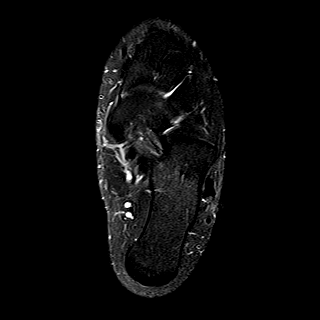
[im 16/32]
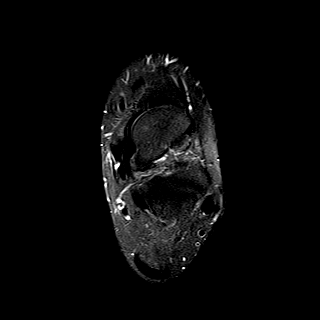
[im 20/32]
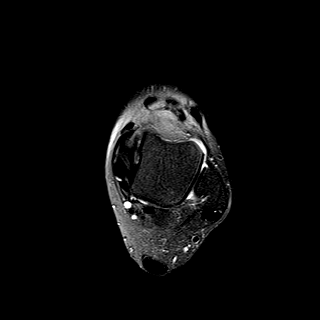
[im 24/32]
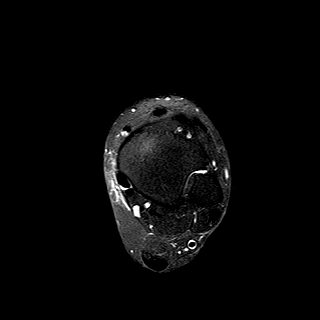
[im 28/32]
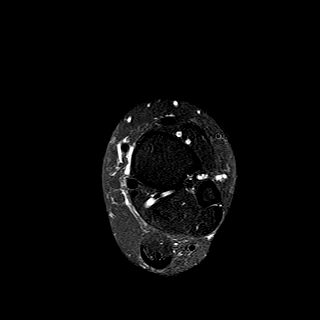
[im 32/32]
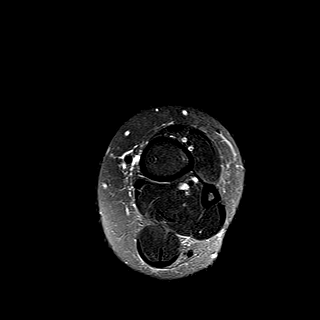

[Series 5: PD fat-sat · axial · 3.0mm · 0.42mm/px · z∈[-95,+26]mm · 9 of 32 slices shown]
[im 1/32]
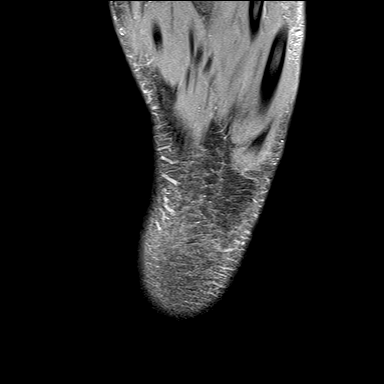
[im 4/32]
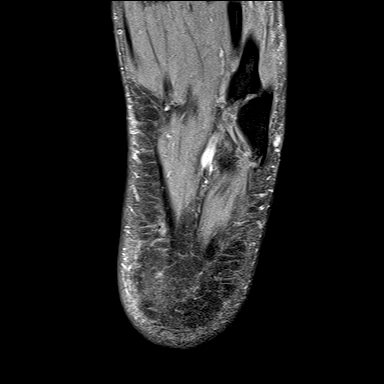
[im 8/32]
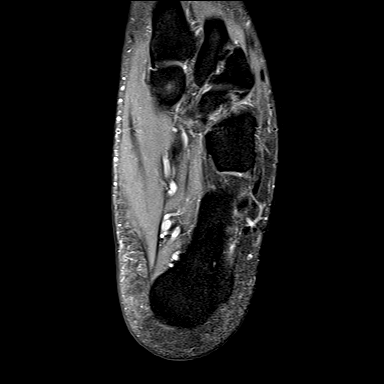
[im 12/32]
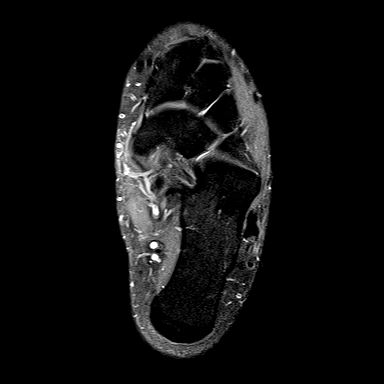
[im 16/32]
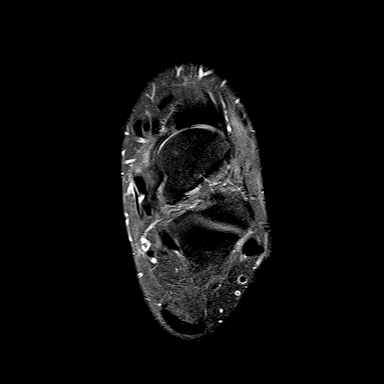
[im 20/32]
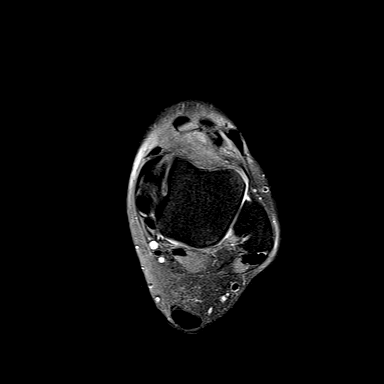
[im 24/32]
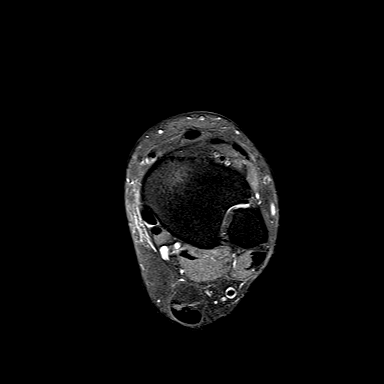
[im 28/32]
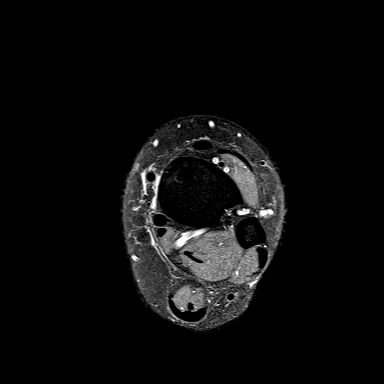
[im 32/32]
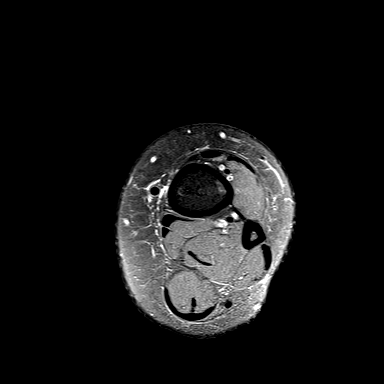

[Series 6: T1 · sagittal · 4.0mm · 0.56mm/px · 6 of 20 slices shown]
[im 1/20]
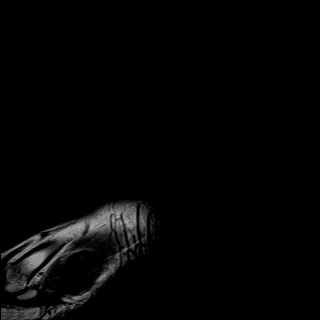
[im 4/20]
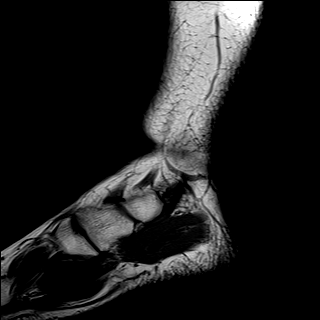
[im 8/20]
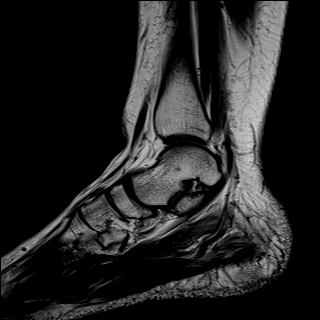
[im 12/20]
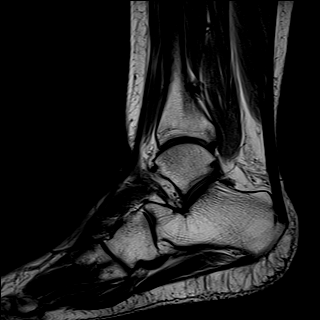
[im 16/20]
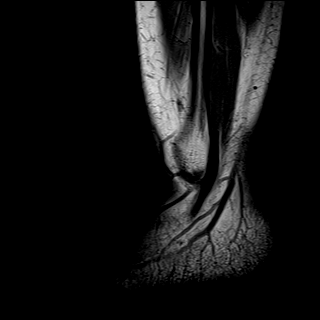
[im 20/20]
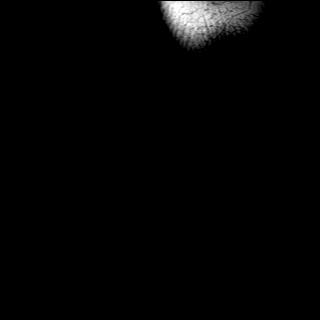

[Series 7: STIR · sagittal · 4.0mm · 0.35mm/px · 4 of 20 slices shown]
[im 1/20]
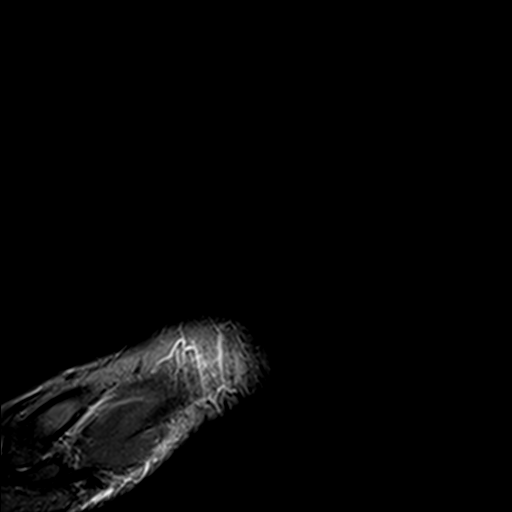
[im 4/20]
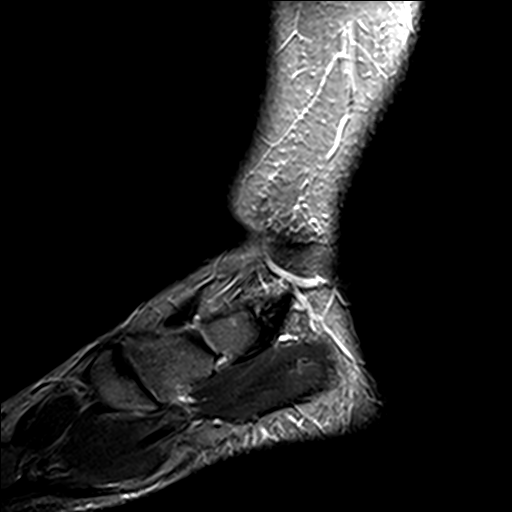
[im 8/20]
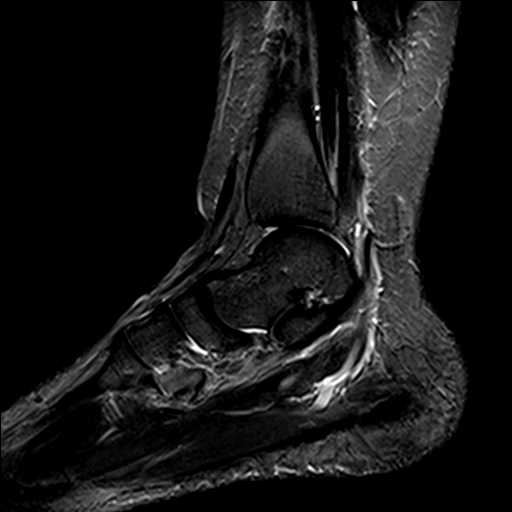
[im 12/20]
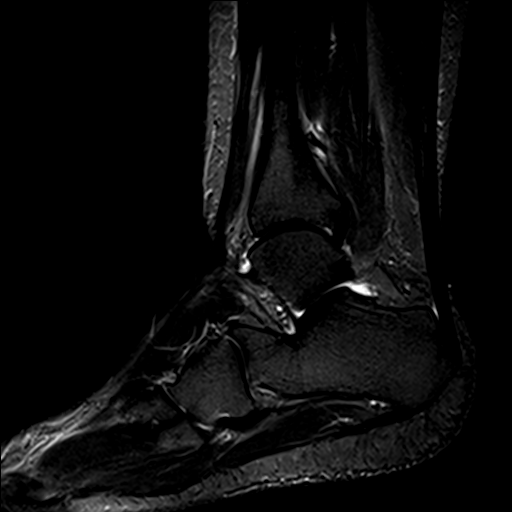

[Series 8: T2 fat-sat · coronal · 3.0mm · 0.50mm/px · 8 of 34 slices shown (2 of 2)]
[im 1/34]
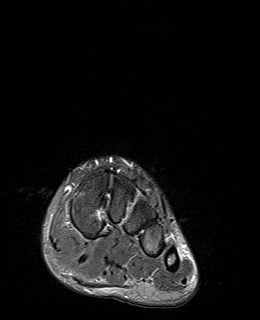
[im 4/34]
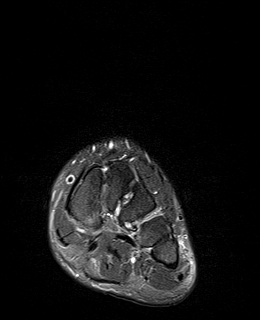
[im 12/34]
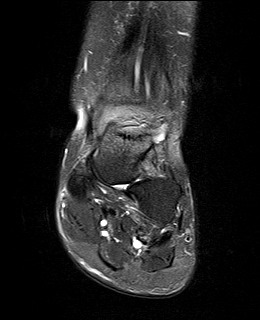
[im 15/34]
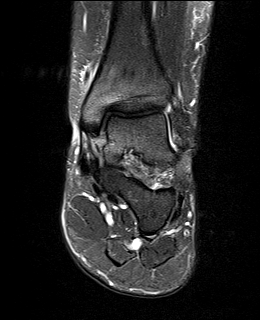
[im 19/34]
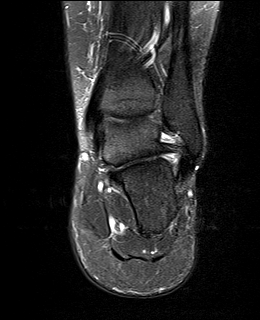
[im 23/34]
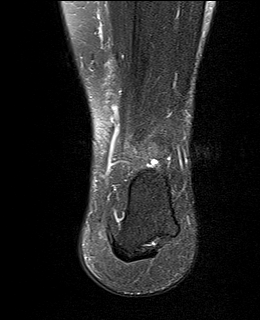
[im 30/34]
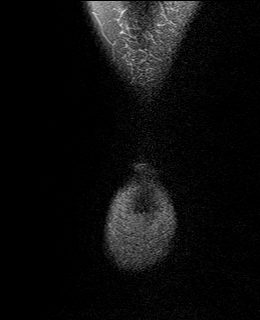
[im 34/34]
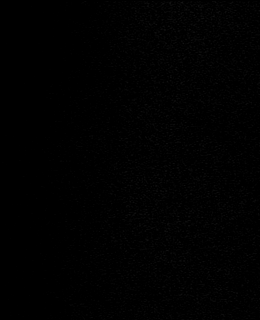

[36 of 40 positions shown; findings below may reference images not displayed]

FINDINGS: TENDONS

Peroneal: Intact peroneus longus and peroneus brevis tendons.

Posteromedial: Intact tibialis posterior, flexor hallucis longus and
flexor digitorum longus tendons.

Anterior: Intact tibialis anterior, extensor hallucis longus and
extensor digitorum longus tendons.

Achilles: Intact.

Plantar Fascia: Plantar fascia intact without thickening, tear, or
perifascial edema.

LIGAMENTS

Lateral: The anterior and posterior tibiofibular ligaments are
intact. The anterior and posterior talofibular ligaments are intact.
Intact calcaneofibular ligament.

Medial: Deltoid ligament and spring ligament complex intact.

CARTILAGE

Ankle Joint: No joint effusion or chondral defect.

Subtalar Joints/Sinus Tarsi: No joint effusion or chondral defect.
Preservation of the anatomic fat within the sinus tarsi.

Bones: No acute fracture or malalignment. No bone marrow edema.
Bidirectional calcaneal enthesophytes. Tiny os navicular without
abnormal marrow signal.

Other: No mass lesion or other abnormality within the tarsal tunnel.
IMPRESSION: 1. Essentially unremarkable MRI of the left ankle. No evidence of
plantar fasciitis. No findings to suggest tarsal tunnel syndrome.
2. Bidirectional calcaneal enthesophytes.

## 2021-10-07 NOTE — Progress Notes (Addendum)
    SUBJECTIVE:   CHIEF COMPLAINT / HPI:   Maria Deleon is a 35 y.o. female with a PMH of recurrent depression, suicidal ideation, and suicide attempts presenting to the clinic for depression follow-up after her most recent psychiatry appointment this morning, when she had passive suicidal ideation.  Depression The patient is currently not taking her 50 mg Zoloft daily consistently. She has enough of the medication on hand, but she struggles to find the motivation to take it always and she misses doses. She previously took 2 pills of Zoloft instead of 1 and found that to be helpful. The last time she consistently took the medication was before July, though she has recently taken a few pills here and there.  Generally, the patient states that she is finding it difficult to find motivation daily and she feels very tired and overwhelmed. 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 She has 3 kids and a husband, who she says is an excellent support system, though she struggles with mental health difficulties of his own. She reports stress from having to move homes by January due to the owner of the home kicking her family out (not missed bills, but needs the home to be vacated).  The patient says she is not currently thinking about hurting herself (says she just feels "hopeless") and if she does want to hurt herself, she will call the suicide hotline or behavioral health. She has the card and information for both of those resources. In a crisis, her husband is also prepared to call emergency services (he has done so in the past).  OBJECTIVE:   BP 118/84   Pulse 90   Ht 5\' 2"  (1.575 m)   Wt 226 lb 12.8 oz (102.9 kg)   LMP 10/02/2021 (Exact Date)   SpO2 100%   BMI 41.48 kg/m   General: Age-appropriate, resting in chair, slumped posture, alert and at baseline Psych: Tired, depressed and tearful affect  ASSESSMENT/PLAN:    Depression, recurrent (HCC) The patient has passive suicidal ideation, but she has no plan and feels that she will seek help if she is suicidal. She also has her husband to call for help should the need arise. She is struggling daily and would benefit from consistently taking her Zoloft.  Emphasized the key importance of resuming Zoloft 50 mg daily and how crucial it is to take the medication consistently for it to have an effect. Asked the patient to self-titrate up to 100 mg daily before her next visit. Discussed a plan to ensure medication is taken, making sure it is in a visible spot (by the toothbrush) and that she sees it every day, working to remember to take it.  The patient has another psychiatry appointment this upcoming Wednesday, November 23rd. Will also follow up with the patient on the 27th of December.    Dimitry 01-17-1979, Medical Student [facility]    Resident Attestation  I saw and evaluated the patient, performing the key elements of the service.I  personally performed or re-performed the history, physical exam, and medical decision making activities of this service and have verified that the service and findings are accurately documented in the student's note. I developed the management plan that is described in the medical student's note, and I agree with the content, with my edits above.    Hospital doctor, PGY3

## 2021-10-07 NOTE — Patient Instructions (Signed)
It was fantastic seeing you today.  I am sorry you are doing so poorly but we are going to work on this.  I want you to take the 50 mg of Zoloft for 3 weeks and then you can increase to 100 mg of Zoloft daily.  To help remind you you could consider setting a phone or taking it every time right after you brush her teeth.  If your symptoms worsen or you have any questions or concerns please call the clinic or the emergency numbers that you have been provided.  We scheduled an appointment for you on 27 December and can determine next steps in management at that time.  Please let me know if you need anything.  Have a wonderful Thanksgiving!

## 2021-10-07 NOTE — BH Specialist Note (Addendum)
Integrated Behavioral Health Initial In-Person Visit  MRN: [id number] Name: Maria Deleon  Number of Integrated Behavioral Health Clinician visits:: 1/6 Session Start time: 945  Session End time: 1030 Total time: 45  minutes  Types of Service: Individual psychotherapy   Subjective: [name] is a 35 y.o. female  Patient was referred by Dr. Nobie Putnam for depression. Patient reports the following symptoms/concerns: Pt reported syx of depression such as lack of motivation, tiredness and feeling overwhelmed. Pt reported she has been feeling depressed since she is having issues with her housing and financials. Pt reported another stressor is her aging parents. Pt shared that she is two years sober from crack cocaine.   Pt shared hx of suicidal ideation and suicide attempts. Pt reported last attempt was 4 yrs ago by hanging. Pt shared she was in therapy for most of her childhood. Pt shared passive SI with no plan. Pt was given hotline and [facility] info if worsens.   Pt reported previous diagnoses of BPD and bipolar II. PT reports she feels like BPD fits because of mood swings.   Pt shared she has not been taking her zoloft and will address in visit with Dr. Nobie Putnam today.   Pt given resource of [employer] [facility] [place] for financial support.    Duration of problem: past few months more acutely; Severity of problem: moderate  Objective: Mood: Depressed and Affect: Tearful Risk of harm to self or others: Suicidal ideation: pt reported passive SI with no plan and intent. Pt was given hotline info and [facility]. Protective factor include family.   Life Context: Family and Social: financial and housing issues School/Work: cleans homes  Self-Care: enjoys video games and her children  Patient and/or Family's Strengths/Protective Factors: Parental Resilience  Goals Addressed: Patient will: Reduce symptoms of: depression: feeling overwhelmed; down; passive SI Increase knowledge  and/or ability of: self-management skills: being able to label depression and challenging depressed thoughts  Demonstrate ability to: Increase healthy adjustment to current life circumstances  Progress towards Goals: Ongoing  Interventions: Interventions utilized: Supportive Counseling and Supportive Reflection  Standardized Assessments completed: C-SSRS Short  Patient and/or Family Response: Pt engaged in treatment planning   Patient Centered Plan: Patient is on the following Treatment Plan(s):  depression treatment plan  Assessment: Patient currently experiencing depression due to life circumstances.   Patient may benefit from CBT and supportive therapy .  Plan: Follow up with behavioral health clinician on : 1 week Behavioral recommendations: f/u with Dr. Nobie Putnam about medication Referral(s): Integrated [employer] (In Clinic)  Royetta Asal, PhD., LMFT

## 2021-10-07 NOTE — Assessment & Plan Note (Signed)
The patient has passive suicidal ideation, but she has no plan and feels that she will seek help if she is suicidal. She also has her husband to call for help should the need arise. She is struggling daily and would benefit from consistently taking her Zoloft.  Emphasized the key importance of resuming Zoloft 50 mg daily and how crucial it is to take the medication consistently for it to have an effect. Asked the patient to self-titrate up to 100 mg daily before her next visit. Discussed a plan to ensure medication is taken, making sure it is in a visible spot (by the toothbrush) and that she sees it every day, working to remember to take it.  The patient has another psychiatry appointment this upcoming Wednesday, November 23rd. Will also follow up with the patient on the 27th of December.

## 2021-10-12 ENCOUNTER — Ambulatory Visit: Payer: 59 | Admitting: Psychology

## 2021-10-12 ENCOUNTER — Telehealth: Payer: Self-pay | Admitting: Psychology

## 2021-10-12 NOTE — Telephone Encounter (Signed)
Attempted to call pt about missed appt. No VM set up.

## 2021-10-17 ENCOUNTER — Encounter: Payer: Self-pay | Admitting: Podiatry

## 2021-10-17 NOTE — Telephone Encounter (Signed)
Please advise 

## 2021-10-24 ENCOUNTER — Ambulatory Visit: Payer: 59 | Admitting: Psychology

## 2021-11-15 ENCOUNTER — Other Ambulatory Visit: Payer: Self-pay

## 2021-11-15 ENCOUNTER — Encounter: Payer: Self-pay | Admitting: Family Medicine

## 2021-11-15 ENCOUNTER — Ambulatory Visit (INDEPENDENT_AMBULATORY_CARE_PROVIDER_SITE_OTHER): Payer: 59 | Admitting: Family Medicine

## 2021-11-15 DIAGNOSIS — F339 Major depressive disorder, recurrent, unspecified: Secondary | ICD-10-CM

## 2021-11-15 MED ORDER — SERTRALINE HCL 100 MG PO TABS: 100.0000 mg | ORAL_TABLET | Freq: Every day | ORAL | 0 refills | Status: DC

## 2021-11-15 NOTE — Patient Instructions (Signed)
It was good seeing you today but I am sorry you are not seeing great improvement with restarting medication.  I want to increase your dose to 100 mg daily.  Please be sure to follow-up with Dr. Shawnee Knapp and I want to see you in 1 month.  I hope you have a great New Year's!

## 2021-11-15 NOTE — Progress Notes (Signed)
° ° °  SUBJECTIVE:   CHIEF COMPLAINT / HPI:   Depression follow-up Patient presents today for depression follow-up.  At last visit we restarted her Zoloft.  She reports that she is not doing well on this dose.  She has been on Zoloft in the past with some help but required much higher doses up to 200 mg daily.  She has been working with our therapy team but is missed several appointments because of scheduling conflicts.  Circled 1 on question 9 of PHQ-9.  Reports that she has no thoughts of self-harm or HI but sometimes just wishes she were not around.  Reports positives in her life are that she is living family and she just got a job which is when she had had in the past but she is making more than she was making with her previous job.  OBJECTIVE:   BP 123/83    Pulse (!) 106    Ht 5\' 2"  (1.575 m)    Wt 225 lb 3.2 oz (102.2 kg)    LMP 10/17/2021 (Approximate)    SpO2 99%    BMI 41.19 kg/m   General: Pleasant, well-appearing 35 year old female Cardiac: Regular rate and rhythm [place]: Breathing, speaking in full sentences Psych: Somewhat happy throughout the exam although there are times that she appears sad.  Denies any SI or HI. PHQ9 SCORE ONLY 11/15/2021 10/07/2021 09/14/2021  PHQ-9 Total Score 23 25 22      ASSESSMENT/PLAN:   Depression, recurrent (HCC) Patient with slightly improved depression since last visit.  On Zoloft 50 mg daily and has been taking her medications regularly for the last few weeks.  At the previous visit the plan was to titrate up to 100 mg daily before her next visit but this was not done.  Titrating up today to 150 mg daily and we will follow-up in 1 month.     09/16/2021, MD [facility]

## 2021-11-17 NOTE — Assessment & Plan Note (Signed)
Patient with slightly improved depression since last visit.  On Zoloft 50 mg daily and has been taking her medications regularly for the last few weeks.  At the previous visit the plan was to titrate up to 100 mg daily before her next visit but this was not done.  Titrating up today to 150 mg daily and we will follow-up in 1 month.

## 2021-11-22 ENCOUNTER — Ambulatory Visit: Payer: 59 | Admitting: Psychology

## 2021-12-05 ENCOUNTER — Telehealth: Payer: Self-pay | Admitting: Gastroenterology

## 2021-12-05 NOTE — Telephone Encounter (Signed)
Request received to transfer GI care from outside practice to [facility].  We appreciate the interest in our practice, however at this time due to high demand from patients without established GI providers we cannot accommodate this transfer.  Ability to accommodate future transfer requests may change over time and the patient can contact us again in 6-12 months if still interested in being seen at [facility].      °

## 2021-12-05 NOTE — Telephone Encounter (Signed)
Hey Dr Barron Alvine this pt would like to establish care with [place] for a new GI, she used to see Dr Adolphus Birchwood at [facility] in 10/2021. Notes are in epic for review, please advise if you would be able to see her as a new patient.

## 2021-12-23 ENCOUNTER — Ambulatory Visit: Payer: 59 | Admitting: Family Medicine

## 2022-01-18 ENCOUNTER — Ambulatory Visit: Payer: 59 | Admitting: Family Medicine

## 2022-01-18 ENCOUNTER — Telehealth: Payer: Self-pay | Admitting: Family Medicine

## 2022-01-18 NOTE — Telephone Encounter (Signed)
Attempted to call patient to discuss her missed appointment today.  I know that she needs an appointment to see an OB/GYN but I am not sure why she needs it.  We will need to further discuss it with her and likely need to see her for an actual appointment. ?

## 2022-03-21 ENCOUNTER — Ambulatory Visit: Payer: 59 | Admitting: Family Medicine

## 2022-03-24 ENCOUNTER — Telehealth: Payer: 59 | Admitting: Physician Assistant

## 2022-03-24 DIAGNOSIS — B820 Intestinal helminthiasis, unspecified: Secondary | ICD-10-CM | POA: Diagnosis not present

## 2022-03-24 MED ORDER — ALBENDAZOLE 200 MG PO TABS: 400.0000 mg | ORAL_TABLET | Freq: Two times a day (BID) | ORAL | 0 refills | Status: AC

## 2022-03-24 NOTE — Patient Instructions (Signed)
?Maria Deleon, thank you for joining Maria Loveless, PA-C for today's virtual visit.  While this provider is not your primary care provider (PCP), if your PCP is located in our provider database this encounter information will be shared with them immediately following your visit. ? ?Consent: ?(Patient) Maria Deleon provided verbal consent for this virtual visit at the beginning of the encounter. ? ?Current Medications: ? ?Current Outpatient Medications:  ?  albendazole (ALBENZA) 200 MG tablet, Take 2 tablets (400 mg total) by mouth 2 (two) times daily for 3 days., Disp: 12 tablet, Rfl: 0 ?  fluticasone (FLONASE) 50 MCG/ACT nasal spray, Place 2 sprays into both nostrils daily., Disp: 16 g, Rfl: 6 ?  ibuprofen (ADVIL,MOTRIN) 200 MG tablet, Take 800 mg by mouth every 6 (six) hours as needed for mild pain., Disp: , Rfl:  ?  pantoprazole (PROTONIX) 40 MG tablet, Take 1 tablet (40 mg total) by mouth daily., Disp: 30 tablet, Rfl: 0 ?  polyethylene glycol (MIRALAX) 17 g packet, Take 17 g by mouth daily., Disp: 14 each, Rfl: 0 ?  sertraline (ZOLOFT) 100 MG tablet, Take 1 tablet (100 mg total) by mouth daily., Disp: 90 tablet, Rfl: 0 ?  tamsulosin (FLOMAX) 0.4 MG CAPS capsule, Take 1 capsule (0.4 mg total) by mouth daily., Disp: 30 capsule, Rfl: 0  ? ?Medications ordered in this encounter:  ?Meds ordered this encounter  ?Medications  ? albendazole (ALBENZA) 200 MG tablet  ?  Sig: Take 2 tablets (400 mg total) by mouth 2 (two) times daily for 3 days.  ?  Dispense:  12 tablet  ?  Refill:  0  ?  Order Specific Question:   Supervising Provider  ?  Answer:   Eber Hong [[id number]]  ?  ? ?*If you need refills on other medications prior to your next appointment, please contact your pharmacy* ? ?Follow-Up: ?Call back or seek an in-person evaluation if the symptoms worsen or if the condition fails to improve as anticipated. ? ?Other Instructions ? ?Albendazole Tablets ?What is this medication? ?ALBENDAZOLE (al BEN da  zole) treats infections caused by parasites, such as tapeworms. It will not treat colds, the flu, or infections caused by bacteria or viruses. ?This medicine may be used for other purposes; ask your health care provider or pharmacist if you have questions. ?COMMON BRAND NAME(S): Albenza ?What should I tell my care team before I take this medication? ?They need to know if you have any of these conditions: ?Biliary tract blockage ?Liver disease ?Low blood cell levels, such as low white cell, platelet, or red cell levels ?An unusual or allergic reaction to albendazole, other medications, foods, dyes, or preservatives ?Pregnant or trying to get pregnant ?Breast-feeding ?How should I use this medication? ?Take this medication by mouth. Take it as directed on the prescription label at the same time every day. You can chew it completely before swallowing or swallow the tablets whole. Follow the directions on the prescription label. Take it with food. Take all of your medication unless your care team tells you to stop it early. Keep taking it even if you think you are better. ?Talk to your care team about the use of this medication in children. While this medication may be prescribed for children for selected conditions, precautions do apply. ?Overdosage: If you think you have taken too much of this medicine contact a poison control center or emergency room at once. ?NOTE: This medicine is only for you. Do not share this medicine with others. ?  What if I miss a dose? ?If you miss a dose, take it as soon as you can. If it is almost time for your next dose, take only that dose. Do not take double or extra doses. ?What may interact with this medication? ?Cimetidine ?Dexamethasone ?Praziquantel ?Theophylline ?This list may not describe all possible interactions. Give your health care provider a list of all the medicines, herbs, non-prescription drugs, or dietary supplements you use. Also tell them if you smoke, drink alcohol, or  use illegal drugs. Some items may interact with your medicine. ?What should I watch for while using this medication? ?Visit your care team for regular checks on your progress. Tell your care team if your symptoms do not start to get better or if they get worse. You may need blood work done while you are taking this medication. ?Talk to your care team if you wish to become pregnant or think you might be pregnant. This medication can cause serious birth defects if taken during pregnancy or for 1 month after stopping treatment. Talk to your care team about reliable contraception. ?Tell your care team right away if you have any change in your eyesight. ?What side effects may I notice from receiving this medication? ?Side effects that you should report to your care team as soon as possible: ?Allergic reactions--skin rash, itching, hives, swelling of the face, lips, tongue, or throat ?Increased pressure around the brain--severe headache, blurry vision, change in vision, nausea, vomiting ?Liver injury--right upper belly pain, loss of appetite, nausea, light-colored stool, dark yellow or brown urine, yellowing skin or eyes, unusual weakness or fatigue ?Seizures ?Unusual bleeding or bruising ?Side effects that usually do not require medical attention (report to your care team if they continue or are bothersome): ?Diarrhea ?Dizziness ?Hair loss ?Headache ?Nausea ?Stomach pain ?This list may not describe all possible side effects. Call your doctor for medical advice about side effects. You may report side effects to FDA at 1-800-FDA-1088. ?Where should I keep my medication? ?Keep out of the reach of children and pets. ?Store at room temperature between 20 and 25 degrees C (68 and 77 degrees F). Keep container tightly closed. Get rid of any unused medication after the expiration date. ?To get rid of medications that are no longer needed or have expired: ?Take the medication to a medication take-back program. Check with your  pharmacy or law enforcement to find a location. ?If you cannot return the medication, check the label or package insert to see if the medication should be thrown out in the garbage or flushed down the toilet. If you are not sure, ask your care team. If it is safe to put it in the trash, empty the medication out of the container. Mix the medication with cat litter, dirt, coffee grounds, or other unwanted substance. Seal the mixture in a bag or container. Put it in the trash. ?NOTE: This sheet is a summary. It may not cover all possible information. If you have questions about this medicine, talk to your doctor, pharmacist, or health care provider. ?? 2023 Elsevier/Gold Standard (2021-12-27 00:00:00) ? ? ? ?If you have been instructed to have an in-person evaluation today at a local Urgent Care facility, please use the link below. It will take you to a list of all of our available [facility], including address, phone number and hours of operation. Please do not delay care.  ?[facility] ? ?If you or a family member do not have a primary care  provider, use the link below to schedule a visit and establish care. When you choose a [facility] primary care physician or advanced practice provider, you gain a long-term partner in health. ?Find a Primary Care Provider ? ?Learn more about [facility]'s in-office and virtual care options: ?[facility] - Get Care Now ?

## 2022-03-24 NOTE — Progress Notes (Signed)
?Virtual Visit Consent  ? ?Maria Deleon, you are scheduled for a virtual visit with a [facility] provider today. Just as with appointments in the office, your consent must be obtained to participate. Your consent will be active for this visit and any virtual visit you may have with one of our providers in the next 365 days. If you have a MyChart account, a copy of this consent can be sent to you electronically. ? ?As this is a virtual visit, video technology does not allow for your provider to perform a traditional examination. This may limit your provider's ability to fully assess your condition. If your provider identifies any concerns that need to be evaluated in person or the need to arrange testing (such as labs, EKG, etc.), we will make arrangements to do so. Although advances in technology are sophisticated, we cannot ensure that it will always work on either your end or our end. If the connection with a video visit is poor, the visit may have to be switched to a telephone visit. With either a video or telephone visit, we are not always able to ensure that we have a secure connection. ? ?By engaging in this virtual visit, you consent to the provision of healthcare and authorize for your insurance to be billed (if applicable) for the services provided during this visit. Depending on your insurance coverage, you may receive a charge related to this service. ? ?I need to obtain your verbal consent now. Are you willing to proceed with your visit today? Maria Deleon has provided verbal consent on 03/24/2022 for a virtual visit (video or telephone). Margaretann Loveless, PA-C ? ?Date: 03/24/2022 7:24 PM ? ?Virtual Visit via Video Note  ? ?IMargaretann Loveless, connected with  Maria Deleon  ([id number], 07/16/1986) on 03/24/22 at  7:15 PM EDT by a video-enabled telemedicine application and verified that I am speaking with the correct person using two identifiers. ? ?Location: ?Patient: Virtual Visit  Location Patient: Mobile ?Provider: Virtual Visit Location Provider: Home Office ?  ?I discussed the limitations of evaluation and management by telemedicine and the availability of in person appointments. The patient expressed understanding and agreed to proceed.   ? ?History of Present Illness: ?Maria Deleon is a 36 y.o. who identifies as a female who was assigned female at birth, and is being seen today for possible intestinal worms. Has a dog that currently has worms. Over the last week she has noticed more abdominal pain/cramping, diarrhea, bloating, nausea, and fatigue. She has noticed some white specs and possible worms in her stool. Does have some anal itching as well. ? ?  ?Problems:  ?Patient Active Problem List  ? Diagnosis Date Noted  ? Diverticulitis 09/15/2021  ? Flank pain 01/11/2021  ? Sore throat 12/04/2020  ? Headache 04/02/2020  ? Dysmenorrhea 03/08/2020  ? Depression, recurrent (HCC) 02/23/2020  ? Obesity (BMI 30-39.9) 02/23/2020  ? Encounter for medical examination to establish care 02/23/2020  ? Inflammatory polyarthropathy (HCC) 09/25/2013  ? Synovitis of wrist 09/25/2013  ? Right wrist pain 09/25/2013  ? Fibromyalgia 08/14/2013  ? Arthralgia 08/14/2013  ? Fatigue 08/14/2013  ? Positive ANA (antinuclear antibody) 08/14/2013  ? Insomnia 07/31/2013  ? Plantar fasciitis 07/31/2013  ?  ?Allergies: No Known Allergies ?Medications:  ?Current Outpatient Medications:  ?  albendazole (ALBENZA) 200 MG tablet, Take 2 tablets (400 mg total) by mouth 2 (two) times daily for 3 days., Disp: 12 tablet, Rfl: 0 ?  fluticasone (FLONASE) 50 MCG/ACT nasal spray,  Place 2 sprays into both nostrils daily., Disp: 16 g, Rfl: 6 ?  ibuprofen (ADVIL,MOTRIN) 200 MG tablet, Take 800 mg by mouth every 6 (six) hours as needed for mild pain., Disp: , Rfl:  ?  pantoprazole (PROTONIX) 40 MG tablet, Take 1 tablet (40 mg total) by mouth daily., Disp: 30 tablet, Rfl: 0 ?  polyethylene glycol (MIRALAX) 17 g packet, Take 17 g  by mouth daily., Disp: 14 each, Rfl: 0 ?  sertraline (ZOLOFT) 100 MG tablet, Take 1 tablet (100 mg total) by mouth daily., Disp: 90 tablet, Rfl: 0 ?  tamsulosin (FLOMAX) 0.4 MG CAPS capsule, Take 1 capsule (0.4 mg total) by mouth daily., Disp: 30 capsule, Rfl: 0 ? ?Observations/Objective: ?Patient is well-developed, well-nourished in no acute distress.  ?Resting comfortably  ?Head is normocephalic, atraumatic.  ?No labored breathing.  ?Speech is clear and coherent with logical content.  ?Patient is alert and oriented at baseline.  ? ? ?Assessment and Plan: ?1. Intestinal worms ?- albendazole (ALBENZA) 200 MG tablet; Take 2 tablets (400 mg total) by mouth 2 (two) times daily for 3 days.  Dispense: 12 tablet; Refill: 0 ? ?- High risk for intestinal worm due to having dog with worms at home she is caring for ?- Albendazole prescribed ?- Seek in person evaluation if symptoms persist or worsen ? ?Follow Up Instructions: ?I discussed the assessment and treatment plan with the patient. The patient was provided an opportunity to ask questions and all were answered. The patient agreed with the plan and demonstrated an understanding of the instructions.  A copy of instructions were sent to the patient via MyChart unless otherwise noted below.  ? ? ?The patient was advised to call back or seek an in-person evaluation if the symptoms worsen or if the condition fails to improve as anticipated. ? ?Time:  ?I spent 11 minutes with the patient via telehealth technology discussing the above problems/concerns.   ? ?Margaretann Loveless, PA-C ?

## 2022-04-25 ENCOUNTER — Encounter: Payer: Self-pay | Admitting: *Deleted

## 2022-07-31 ENCOUNTER — Telehealth: Payer: 59 | Admitting: Physician Assistant

## 2022-07-31 DIAGNOSIS — B359 Dermatophytosis, unspecified: Secondary | ICD-10-CM

## 2022-07-31 DIAGNOSIS — J208 Acute bronchitis due to other specified organisms: Secondary | ICD-10-CM | POA: Diagnosis not present

## 2022-07-31 MED ORDER — BENZONATATE 100 MG PO CAPS
100.0000 mg | ORAL_CAPSULE | Freq: Three times a day (TID) | ORAL | 0 refills | Status: DC | PRN
Start: 2022-07-31 — End: 2023-09-06

## 2022-07-31 MED ORDER — PREDNISONE 20 MG PO TABS: 40.0000 mg | ORAL_TABLET | Freq: Every day | ORAL | 0 refills | Status: DC

## 2022-07-31 MED ORDER — CLOTRIMAZOLE-BETAMETHASONE 1-0.05 % EX CREA: 1.0000 | TOPICAL_CREAM | Freq: Two times a day (BID) | CUTANEOUS | 0 refills | Status: DC

## 2022-07-31 MED ORDER — PSEUDOEPH-BROMPHEN-DM 30-2-10 MG/5ML PO SYRP: 5.0000 mL | ORAL_SOLUTION | Freq: Four times a day (QID) | ORAL | 0 refills | Status: DC | PRN

## 2022-07-31 NOTE — Patient Instructions (Addendum)
Shelda Altes, thank you for joining Margaretann Loveless, PA-C for today's virtual visit.  While this provider is not your primary care provider (PCP), if your PCP is located in our provider database this encounter information will be shared with them immediately following your visit.  Consent: (Patient) Maria Deleon provided verbal consent for this virtual visit at the beginning of the encounter.  Current Medications:  Current Outpatient Medications:    benzonatate (TESSALON) 100 MG capsule, Take 1 capsule (100 mg total) by mouth 3 (three) times daily as needed., Disp: 30 capsule, Rfl: 0   brompheniramine-pseudoephedrine-DM 30-2-10 MG/5ML syrup, Take 5 mLs by mouth 4 (four) times daily as needed., Disp: 120 mL, Rfl: 0   clotrimazole-betamethasone (LOTRISONE) cream, Apply 1 Application topically 2 (two) times daily., Disp: 30 g, Rfl: 0   predniSONE (DELTASONE) 20 MG tablet, Take 2 tablets (40 mg total) by mouth daily with breakfast., Disp: 10 tablet, Rfl: 0   fluticasone (FLONASE) 50 MCG/ACT nasal spray, Place 2 sprays into both nostrils daily., Disp: 16 g, Rfl: 6   ibuprofen (ADVIL,MOTRIN) 200 MG tablet, Take 800 mg by mouth every 6 (six) hours as needed for mild pain., Disp: , Rfl:    pantoprazole (PROTONIX) 40 MG tablet, Take 1 tablet (40 mg total) by mouth daily., Disp: 30 tablet, Rfl: 0   polyethylene glycol (MIRALAX) 17 g packet, Take 17 g by mouth daily., Disp: 14 each, Rfl: 0   sertraline (ZOLOFT) 100 MG tablet, Take 1 tablet (100 mg total) by mouth daily., Disp: 90 tablet, Rfl: 0   tamsulosin (FLOMAX) 0.4 MG CAPS capsule, Take 1 capsule (0.4 mg total) by mouth daily., Disp: 30 capsule, Rfl: 0   Medications ordered in this encounter:  Meds ordered this encounter  Medications   predniSONE (DELTASONE) 20 MG tablet    Sig: Take 2 tablets (40 mg total) by mouth daily with breakfast.    Dispense:  10 tablet    Refill:  0    Order Specific Question:   Supervising Provider     Answer:   Merrilee Jansky [[id number]]   brompheniramine-pseudoephedrine-DM 30-2-10 MG/5ML syrup    Sig: Take 5 mLs by mouth 4 (four) times daily as needed.    Dispense:  120 mL    Refill:  0    Order Specific Question:   Supervising Provider    Answer:   Merrilee Jansky [[id number]]   benzonatate (TESSALON) 100 MG capsule    Sig: Take 1 capsule (100 mg total) by mouth 3 (three) times daily as needed.    Dispense:  30 capsule    Refill:  0    Order Specific Question:   Supervising Provider    Answer:   Merrilee Jansky [[id number]]   clotrimazole-betamethasone (LOTRISONE) cream    Sig: Apply 1 Application topically 2 (two) times daily.    Dispense:  30 g    Refill:  0    Order Specific Question:   Supervising Provider    Answer:   Merrilee Jansky [id number]     *If you need refills on other medications prior to your next appointment, please contact your pharmacy*  Follow-Up: Call back or seek an in-person evaluation if the symptoms worsen or if the condition fails to improve as anticipated.  Other Instructions  Acute Bronchitis, Adult  Acute bronchitis is sudden inflammation of the main airways (bronchi) that come off the windpipe (trachea) in the lungs. The swelling causes the airways to get smaller and make  more mucus than normal. This can make it hard to breathe and can cause coughing or noisy breathing (wheezing). Acute bronchitis may last several weeks. The cough may last longer. Allergies, asthma, and exposure to smoke may make the condition worse. What are the causes? This condition can be caused by germs and by substances that irritate the lungs, including: Cold and flu viruses. The most common cause of this condition is the virus that causes the common cold. Bacteria. This is less common. Breathing in substances that irritate the lungs, including: Smoke from cigarettes and other forms of tobacco. Dust and pollen. Fumes from household cleaning products, gases, or burned  fuel. Indoor or outdoor air pollution. What increases the risk? The following factors may make you more likely to develop this condition: A weak body's defense system, also called the immune system. A condition that affects your lungs and breathing, such as asthma. What are the signs or symptoms? Common symptoms of this condition include: Coughing. This may bring up clear, yellow, or green mucus from your lungs (sputum). Wheezing. Runny or stuffy nose. Having too much mucus in your lungs (chest congestion). Shortness of breath. Aches and pains, including sore throat or chest. How is this diagnosed? This condition is usually diagnosed based on: Your symptoms and medical history. A physical exam. You may also have other tests, including tests to rule out other conditions, such as pneumonia. These tests include: A test of lung function. Test of a mucus sample to look for the presence of bacteria. Tests to check the oxygen level in your blood. Blood tests. Chest X-ray. How is this treated? Most cases of acute bronchitis clear up over time without treatment. Your health care provider may recommend: Drinking more fluids to help thin your mucus so it is easier to cough up. Taking inhaled medicine (inhaler) to improve air flow in and out of your lungs. Using a vaporizer or a humidifier. These are machines that add water to the air to help you breathe better. Taking a medicine that thins mucus and clears congestion (expectorant). Taking a medicine that prevents or stops coughing (cough suppressant). It is not common to take an antibiotic medicine for this condition. Follow these instructions at home:  Take over-the-counter and prescription medicines only as told by your health care provider. Use an inhaler, vaporizer, or humidifier as told by your health care provider. Take two teaspoons (10 mL) of honey at bedtime to lessen coughing at night. Drink enough fluid to keep your urine pale  yellow. Do not use any products that contain nicotine or tobacco. These products include cigarettes, chewing tobacco, and vaping devices, such as e-cigarettes. If you need help quitting, ask your health care provider. Get plenty of rest. Return to your normal activities as told by your health care provider. Ask your health care provider what activities are safe for you. Keep all follow-up visits. This is important. How is this prevented? To lower your risk of getting this condition again: Wash your hands often with soap and water for at least 20 seconds. If soap and water are not available, use hand sanitizer. Avoid contact with people who have cold symptoms. Try not to touch your mouth, nose, or eyes with your hands. Avoid breathing in smoke or chemical fumes. Breathing smoke or chemical fumes will make your condition worse. Get the flu shot every year. Contact a health care provider if: Your symptoms do not improve after 2 weeks. You have trouble coughing up the mucus. Your cough  keeps you awake at night. You have a fever. Get help right away if you: Cough up blood. Feel pain in your chest. Have severe shortness of breath. Faint or keep feeling like you are going to faint. Have a severe headache. Have a fever or chills that get worse. These symptoms may represent a serious problem that is an emergency. Do not wait to see if the symptoms will go away. Get medical help right away. Call your local emergency services (911 in the [place] Do not drive yourself to the hospital. Summary Acute bronchitis is inflammation of the main airways (bronchi) that come off the windpipe (trachea) in the lungs. The swelling causes the airways to get smaller and make more mucus than normal. Drinking more fluids can help thin your mucus so it is easier to cough up. Take over-the-counter and prescription medicines only as told by your health care provider. Do not use any products that contain nicotine or  tobacco. These products include cigarettes, chewing tobacco, and vaping devices, such as e-cigarettes. If you need help quitting, ask your health care provider. Contact a health care provider if your symptoms do not improve after 2 weeks. This information is not intended to replace advice given to you by your health care provider. Make sure you discuss any questions you have with your health care provider. Document Revised: 02/16/2022 Document Reviewed: 03/09/2021 Elsevier Patient Education  2023 [employer]  Body Ringworm Body ringworm is an infection of the skin that often causes a ring-shaped rash. Body ringworm is also called tinea corporis. Body ringworm can affect any part of your skin. This condition is easily spread from person to person (is very contagious). What are the causes? This condition is caused by fungi called dermatophytes. The condition develops when these fungi grow out of control on the skin. You can get this condition if you touch a person or animal that has it. You can also get it if you share any items with an infected person or pet. These include: Clothing, bedding, and towels. Brushes or combs. Gym equipment. Any other object that has the fungus on it. What increases the risk? You are more likely to develop this condition if you: Play sports that involve close physical contact, such as wrestling. Sweat a lot. Live in areas that are hot and humid. Use public showers. Have a weakened immune system. What are the signs or symptoms? Symptoms of this condition include: Itchy, raised red spots and bumps. Red scaly patches. A ring-shaped rash. The rash may have: A clear center. Scales or red bumps at its center. Redness near its borders. Dry and scaly skin on or around it. How is this diagnosed? This condition can usually be diagnosed with a skin exam. A skin scraping may be taken from the affected area and examined under a microscope to see if the fungus is  present. How is this treated? This condition may be treated with: An antifungal cream or ointment. An antifungal shampoo. Antifungal medicines. These may be prescribed if your ringworm: Is severe. Keeps coming back. Lasts a long time. Follow these instructions at home: Take over-the-counter and prescription medicines only as told by your health care provider. If you were given an antifungal cream or ointment: Use it as told by your health care provider. Wash the infected area and dry it completely before applying the cream or ointment. If you were given an antifungal shampoo: Use it as told by your health care provider. Leave the shampoo on your  body for 3-5 minutes before rinsing. While you have a rash: Wear loose clothing to stop clothes from rubbing and irritating it. Wash or change your bed sheets every night. Disinfect or throw out items that may be infected. Wash clothes and bed sheets in hot water. Wash your hands often with soap and water. If soap and water are not available, use hand sanitizer. If your pet has the same infection, take your pet to see a veterinarian for treatment. How is this prevented? Take a bath or shower every day and after every time you work out or play sports. Dry your skin completely after bathing. Wear sandals or shoes in public places and showers. Change your clothes every day. Wash athletic clothes after each use. Do not share personal items with others. Avoid touching red patches of skin on other people. Avoid touching pets that have bald spots. If you touch an animal that has a bald spot, wash your hands. Contact a health care provider if: Your rash continues to spread after 7 days of treatment. Your rash is not gone in 4 weeks. The area around your rash gets red, warm, tender, and swollen. Summary Body ringworm is an infection of the skin that often causes a ring-shaped rash. This condition is easily spread from person to person (is very  contagious). This condition may be treated with antifungal cream or ointment, antifungal shampoo, or antifungal medicines. Take over-the-counter and prescription medicines only as told by your health care provider. This information is not intended to replace advice given to you by your health care provider. Make sure you discuss any questions you have with your health care provider. Document Revised: 01/18/2022 Document Reviewed: 08/30/2021 Elsevier Patient Education  2023 [employer]    If you have been instructed to have an in-person evaluation today at a local Urgent Care facility, please use the link below. It will take you to a list of all of our available [facility], including address, phone number and hours of operation. Please do not delay care.  [facility]  If you or a family member do not have a primary care provider, use the link below to schedule a visit and establish care. When you choose a [facility] primary care physician or advanced practice provider, you gain a long-term partner in health. Find a Primary Care Provider  Learn more about [facility] in-office and virtual care options: [facility] - Get Care Now

## 2022-07-31 NOTE — Progress Notes (Signed)
Virtual Visit Consent   Maria Deleon, you are scheduled for a virtual visit with a [facility] provider today. Just as with appointments in the office, your consent must be obtained to participate. Your consent will be active for this visit and any virtual visit you may have with one of our providers in the next 365 days. If you have a MyChart account, a copy of this consent can be sent to you electronically.  As this is a virtual visit, video technology does not allow for your provider to perform a traditional examination. This may limit your provider's ability to fully assess your condition. If your provider identifies any concerns that need to be evaluated in person or the need to arrange testing (such as labs, EKG, etc.), we will make arrangements to do so. Although advances in technology are sophisticated, we cannot ensure that it will always work on either your end or our end. If the connection with a video visit is poor, the visit may have to be switched to a telephone visit. With either a video or telephone visit, we are not always able to ensure that we have a secure connection.  By engaging in this virtual visit, you consent to the provision of healthcare and authorize for your insurance to be billed (if applicable) for the services provided during this visit. Depending on your insurance coverage, you may receive a charge related to this service.  I need to obtain your verbal consent now. Are you willing to proceed with your visit today? Maria Deleon has provided verbal consent on 07/31/2022 for a virtual visit (video or telephone). Maria Loveless, PA-C  Date: 07/31/2022 12:23 PM  Virtual Visit via Video Note   I, Maria Deleon, connected with  Maria Deleon  ([id number], 24-Sep-1986) on 07/31/22 at 12:15 PM EDT by a video-enabled telemedicine application and verified that I am speaking with the correct person using two identifiers.  Location: Patient: Virtual  Visit Location Patient: Home Provider: Virtual Visit Location Provider: Home Office   I discussed the limitations of evaluation and management by telemedicine and the availability of in person appointments. The patient expressed understanding and agreed to proceed.    History of Present Illness: Maria Deleon is a 36 y.o. who identifies as a female who was assigned female at birth, and is being seen today for cough and headache.  HPI: Cough This is a new problem. The current episode started in the past 7 days. The problem has been gradually worsening. The problem occurs constantly. The cough is Non-productive. Associated symptoms include a fever (subjective fever on Saturday), headaches, nasal congestion, a sore throat (mild) and shortness of breath (with cough). Pertinent negatives include no chills, postnasal drip, rhinorrhea or wheezing. She has tried OTC cough suppressant (robitussin) for the symptoms. The treatment provided no relief. Her past medical history is significant for bronchitis and environmental allergies. There is no history of asthma or pneumonia.      Problems:  Patient Active Problem List   Diagnosis Date Noted   Diverticulitis 09/15/2021   Flank pain 01/11/2021   Sore throat 12/04/2020   Headache 04/02/2020   Dysmenorrhea 03/08/2020   Depression, recurrent (HCC) 02/23/2020   Obesity (BMI 30-39.9) 02/23/2020   Encounter for medical examination to establish care 02/23/2020   Inflammatory polyarthropathy (HCC) 09/25/2013   Synovitis of wrist 09/25/2013   Right wrist pain 09/25/2013   Fibromyalgia 08/14/2013   Arthralgia 08/14/2013   Fatigue 08/14/2013   Positive ANA (antinuclear antibody) 08/14/2013  Insomnia 07/31/2013   Plantar fasciitis 07/31/2013    Allergies: No Known Allergies Medications:  Current Outpatient Medications:    benzonatate (TESSALON) 100 MG capsule, Take 1 capsule (100 mg total) by mouth 3 (three) times daily as needed., Disp: 30  capsule, Rfl: 0   brompheniramine-pseudoephedrine-DM 30-2-10 MG/5ML syrup, Take 5 mLs by mouth 4 (four) times daily as needed., Disp: 120 mL, Rfl: 0   clotrimazole-betamethasone (LOTRISONE) cream, Apply 1 Application topically 2 (two) times daily., Disp: 30 g, Rfl: 0   predniSONE (DELTASONE) 20 MG tablet, Take 2 tablets (40 mg total) by mouth daily with breakfast., Disp: 10 tablet, Rfl: 0   fluticasone (FLONASE) 50 MCG/ACT nasal spray, Place 2 sprays into both nostrils daily., Disp: 16 g, Rfl: 6   ibuprofen (ADVIL,MOTRIN) 200 MG tablet, Take 800 mg by mouth every 6 (six) hours as needed for mild pain., Disp: , Rfl:    pantoprazole (PROTONIX) 40 MG tablet, Take 1 tablet (40 mg total) by mouth daily., Disp: 30 tablet, Rfl: 0   polyethylene glycol (MIRALAX) 17 g packet, Take 17 g by mouth daily., Disp: 14 each, Rfl: 0   sertraline (ZOLOFT) 100 MG tablet, Take 1 tablet (100 mg total) by mouth daily., Disp: 90 tablet, Rfl: 0   tamsulosin (FLOMAX) 0.4 MG CAPS capsule, Take 1 capsule (0.4 mg total) by mouth daily., Disp: 30 capsule, Rfl: 0  Observations/Objective: Patient is well-developed, well-nourished in no acute distress.  Resting comfortably at home.  Head is normocephalic, atraumatic.  No labored breathing.  Speech is clear and coherent with logical content.  Patient is alert and oriented at baseline.  Dry, hacking cough heard Small erythematous lesion noted on left medial thigh just above the knee that has some scaling noted and well-demarcated, irregular borders  Assessment and Plan: 1. Acute viral bronchitis - predniSONE (DELTASONE) 20 MG tablet; Take 2 tablets (40 mg total) by mouth daily with breakfast.  Dispense: 10 tablet; Refill: 0 - brompheniramine-pseudoephedrine-DM 30-2-10 MG/5ML syrup; Take 5 mLs by mouth 4 (four) times daily as needed.  Dispense: 120 mL; Refill: 0 - benzonatate (TESSALON) 100 MG capsule; Take 1 capsule (100 mg total) by mouth 3 (three) times daily as needed.   Dispense: 30 capsule; Refill: 0  2. Ringworm - clotrimazole-betamethasone (LOTRISONE) cream; Apply 1 Application topically 2 (two) times daily.  Dispense: 30 g; Refill: 0  - Worsening over a week despite OTC medications - Will treat with Prednisone, Bromfed DM and tessalon perles - Can continue Mucinex  - Lotrisone for possible ring worm - Push fluids.  - Rest.  - Steam and humidifier can help - Seek in person evaluation if worsening or symptoms fail to improve    Follow Up Instructions: I discussed the assessment and treatment plan with the patient. The patient was provided an opportunity to ask questions and all were answered. The patient agreed with the plan and demonstrated an understanding of the instructions.  A copy of instructions were sent to the patient via MyChart unless otherwise noted below.    The patient was advised to call back or seek an in-person evaluation if the symptoms worsen or if the condition fails to improve as anticipated.  Time:  I spent 13 minutes with the patient via telehealth technology discussing the above problems/concerns.    Maria Loveless, PA-C

## 2023-04-26 ENCOUNTER — Ambulatory Visit: Payer: 59 | Admitting: Student

## 2023-05-10 ENCOUNTER — Ambulatory Visit (INDEPENDENT_AMBULATORY_CARE_PROVIDER_SITE_OTHER): Payer: 59 | Admitting: Student

## 2023-05-10 ENCOUNTER — Ambulatory Visit (HOSPITAL_COMMUNITY)
Admission: RE | Admit: 2023-05-10 | Discharge: 2023-05-10 | Disposition: A | Discharge status: Home / Self Care | Payer: 59 | Source: Ambulatory Visit | Attending: Family Medicine | Admitting: Family Medicine

## 2023-05-10 ENCOUNTER — Encounter: Payer: Self-pay | Admitting: Student

## 2023-05-10 VITALS — BP 102/78 | HR 77 | Ht 62.0 in | Wt 231.0 lb

## 2023-05-10 DIAGNOSIS — F339 Major depressive disorder, recurrent, unspecified: Secondary | ICD-10-CM | POA: Diagnosis not present

## 2023-05-10 DIAGNOSIS — R110 Nausea: Secondary | ICD-10-CM

## 2023-05-10 DIAGNOSIS — R42 Dizziness and giddiness: Secondary | ICD-10-CM

## 2023-05-10 LAB — POCT GLYCOSYLATED HEMOGLOBIN (HGB A1C): Hemoglobin A1C: 5.6 % (ref 4.0–5.6)

## 2023-05-10 MED ORDER — ONDANSETRON HCL 4 MG PO TABS: 4.0000 mg | ORAL_TABLET | Freq: Three times a day (TID) | ORAL | 0 refills | Status: AC | PRN

## 2023-05-10 MED ORDER — SERTRALINE HCL 100 MG PO TABS
100.0000 mg | ORAL_TABLET | Freq: Every day | ORAL | 0 refills | Status: AC
Start: 2023-05-10 — End: ?

## 2023-05-10 NOTE — Assessment & Plan Note (Addendum)
Unclear cause of patient's constellation of symptoms including lightheadedness, palpitations, nausea, body ache and fatigue.  Broad differential includes A-fib/arrhythmia, orthostatics, vasovagal,  metabolic process, autoimmune or Anxiety. Her EKG was was normal. Vitals and exam were unremarkable. Low concern for infection give prolonged duration.  Will obtain labs and follow closely. -Ordered labs for CBC, A1c, Lipid panel, TSH -Obtained EKG -Rx as needed Zofran for nausea -Follow up in 3-4 weeks

## 2023-05-10 NOTE — Assessment & Plan Note (Addendum)
Elevated PHQ-9 which is chronic for patient.  No active SI/HI.  She's been on Zoloft but endorses noncompliance and has not taken medication in months.  Had improvement of symptoms with Zoloft in the past.  Suspect patient have underlying anxierty as well. - Will restart her Zoloft.   -Encourage patient  to seek counseling -Follow up in 3-4 weeks

## 2023-05-10 NOTE — Patient Instructions (Addendum)
It was wonderful to meet you today. Thank you for allowing me to be a part of your care. Below is a short summary of what we discussed at your visit today:  Today we obtained EKG which was normal.  I ordered lab for your thyroid level, blood count, A1c for diabetes, cholesterol level and electrolytes level.  Sometimes heart arrhythmia, anxiety, and panic problems can cause some of the symptoms you are having.  I have ordered labs to check for the cause of these symptoms.  Some of this could cause lightheadedness and feeling that you are having.  Also refilled your Zoloft and have sent in prescription for Zofran which you will take for your nausea no more than 3 times in a day.  I Recommend follow-up in 3-4 weeks.   Please bring all of your medications to every appointment!  If you have any questions or concerns, please do not hesitate to contact us via phone or MyChart message.   Jerre Simon, MD [facility]

## 2023-05-10 NOTE — Progress Notes (Signed)
    SUBJECTIVE:   CHIEF COMPLAINT / HPI:   Patient is a 37 year old female presenting today for illness. She reports she has been experiencing nausea, shakiness, body ache (shoulder, back and legs), soreness, palpitations and lightheadedness. This comes on mostly after eating. Feel like he's about to passout but never passed out It first happens 3 years ago but self resolved but current episodes started about 2 months ago and now daily in the last week or two.  Patient describes herself as an anxious person over all.Was on zoloft which helped with her anxiety slightly. Denies any fever, loss of conscious, chest pain and  No history of heroin or IV injection  She suspects she could have low sugars but no official diagnosis of diabetes or on diabetic medication. Of note patient has seen rheumatologist in the past and told she had abnormal ANA, however the results were never followed up.  PERTINENT  PMH / PSH: Reviewed  OBJECTIVE:   BP 102/78 (BP Location: Left Arm, Patient Position: Sitting, Cuff Size: Large)   Pulse 77   Ht 5\' 2"  (1.575 m)   Wt 231 lb (104.8 kg)   SpO2 96%   BMI 42.25 kg/m    Physical Exam General: Alert, well appearing, NAD Cardiovascular: RRR, No Murmurs, Normal S2/S2 Respiratory: CTAB, No wheezing or Rales Abdomen: No distension or tenderness Extremities: No edema on extremities   Skin: Warm and dry Musculoskeletal: generalized body soreness shoulder, abdomen extremities  ASSESSMENT/PLAN:   Depression, recurrent (HCC) Elevated PHQ-9 which is chronic for patient.  No active SI/HI.  She's been on Zoloft but endorses noncompliance and has not taken medication in months.  Had improvement of symptoms with Zoloft in the past.  Suspect patient have underlying anxierty as well. - Will restart her Zoloft.   -Encourage patient  to seek counseling -Follow up in 3-4 weeks  Lightheadedness Unclear cause of patient's constellation of symptoms including  lightheadedness, palpitations, nausea, body ache and fatigue.  Broad differential includes A-fib/arrhythmia, orthostatics, vasovagal,  metabolic process, autoimmune or Anxiety. Her EKG was was normal. Vitals and exam were unremarkable. Low concern for infection give prolonged duration.  Will obtain labs and follow closely. -Ordered labs for CBC, A1c, Lipid panel, TSH -Obtained EKG -Rx as needed Zofran for nausea -Follow up in 3-4 weeks    Jerre Simon, MD [facility]

## 2023-05-10 NOTE — Progress Notes (Signed)
Experiencing nausea, light headed, shaky, body aches / sore - feels that sugar is low.  Requesting to restart Zoloft.  Requesting to discuss weight loss options

## 2023-05-11 LAB — CBC
Hematocrit: 44.8 % (ref 34.0–46.6)
Hemoglobin: 15.1 g/dL (ref 11.1–15.9)
MCH: 29.5 pg (ref 26.6–33.0)
MCHC: 33.7 g/dL (ref 31.5–35.7)
MCV: 88 fL (ref 79–97)
Platelets: 404 10*3/uL (ref 150–450)
RBC: 5.12 x10E6/uL (ref 3.77–5.28)
RDW: 12.8 % (ref 11.7–15.4)
WBC: 13 10*3/uL — ABNORMAL HIGH (ref 3.4–10.8)

## 2023-05-11 LAB — BASIC METABOLIC PANEL
BUN/Creatinine Ratio: 11 (ref 9–23)
BUN: 7 mg/dL (ref 6–20)
CO2: 21 mmol/L (ref 20–29)
Calcium: 9 mg/dL (ref 8.7–10.2)
Chloride: 106 mmol/L (ref 96–106)
Creatinine, Ser: 0.66 mg/dL (ref 0.57–1.00)
Glucose: 108 mg/dL — ABNORMAL HIGH (ref 70–99)
Potassium: 4.5 mmol/L (ref 3.5–5.2)
Sodium: 139 mmol/L (ref 134–144)
eGFR: 116 mL/min/{1.73_m2} (ref >59)

## 2023-05-11 LAB — LIPID PANEL
Chol/HDL Ratio: 6.3 ratio — ABNORMAL HIGH (ref 0.0–4.4)
Cholesterol, Total: 202 mg/dL — ABNORMAL HIGH (ref 100–199)
HDL: 32 mg/dL — ABNORMAL LOW (ref >39)
LDL Chol Calc (NIH): 147 mg/dL — ABNORMAL HIGH (ref 0–99)
Triglycerides: 127 mg/dL (ref 0–149)
VLDL Cholesterol Cal: 23 mg/dL (ref 5–40)

## 2023-05-11 LAB — TSH: TSH: 2.13 u[IU]/mL (ref 0.450–4.500)

## 2023-05-16 ENCOUNTER — Ambulatory Visit (INDEPENDENT_AMBULATORY_CARE_PROVIDER_SITE_OTHER): Payer: 59

## 2023-05-16 ENCOUNTER — Ambulatory Visit (INDEPENDENT_AMBULATORY_CARE_PROVIDER_SITE_OTHER): Payer: 59 | Admitting: Podiatry

## 2023-05-16 ENCOUNTER — Other Ambulatory Visit: Payer: Self-pay | Admitting: Podiatry

## 2023-05-16 DIAGNOSIS — R52 Pain, unspecified: Secondary | ICD-10-CM

## 2023-05-16 DIAGNOSIS — M722 Plantar fascial fibromatosis: Secondary | ICD-10-CM

## 2023-05-16 MED ORDER — TRIAMCINOLONE ACETONIDE 10 MG/ML IJ SUSP
10.0000 mg | Freq: Once | INTRAMUSCULAR | Status: AC
Start: 2023-05-16 — End: 2023-05-16
  Administered 2023-05-16: 10 mg

## 2023-05-16 MED ORDER — DICLOFENAC SODIUM 75 MG PO TBEC: 75.0000 mg | DELAYED_RELEASE_TABLET | Freq: Two times a day (BID) | ORAL | 2 refills | Status: AC

## 2023-05-16 NOTE — Progress Notes (Signed)
Subjective:   Patient ID: Maria Deleon, female   DOB: 37 y.o.   MRN: [id number]   HPI Patient presents with severe pain in the right heel both plantar and posterior and states she cannot really not bear weight on her foot.  States it started a couple months ago but is worsened over the last month and is at the point where it is almost impossible for her to bear weight   ROS      Objective:  Physical Exam  Neuro vas scaler status intact exquisite discomfort plantar aspect right heel and into the posterior aspect right heel around the Achilles insertion with fluid buildup and intense discomfort     Assessment:  Acute painful Planter fasciitis Achilles tendinitis right with a case that is very hard for her to bear weight     Plan:  H&P reviewed x-rays great length.  Due to the intensity of discomfort I did dispense air fracture walker properly fitted to her lower leg with explanation about wearing this at all times and I did do sterile prep and I injected the plantar fascia 3 mg Kenalog 5 mg Xylocaine and placed her on diclofenac.  Reappoint to recheck again in 3 weeks the boot was fitted properly to her lower leg with all instructions given  X-rays indicate spur formation no indication stress fracture arthritis

## 2023-05-29 ENCOUNTER — Ambulatory Visit: Payer: 59 | Admitting: Student

## 2023-06-05 ENCOUNTER — Ambulatory Visit: Payer: 59 | Admitting: Student

## 2023-06-13 ENCOUNTER — Ambulatory Visit: Payer: 59 | Admitting: Podiatry

## 2023-06-20 ENCOUNTER — Ambulatory Visit: Payer: 59 | Admitting: Podiatry

## 2023-06-21 ENCOUNTER — Ambulatory Visit: Payer: 59 | Admitting: Podiatry

## 2023-08-06 ENCOUNTER — Ambulatory Visit (INDEPENDENT_AMBULATORY_CARE_PROVIDER_SITE_OTHER): Payer: 59 | Admitting: Student

## 2023-08-06 VITALS — BP 124/87 | HR 97 | Ht 62.0 in | Wt 227.2 lb

## 2023-08-06 DIAGNOSIS — E669 Obesity, unspecified: Secondary | ICD-10-CM

## 2023-08-06 DIAGNOSIS — F339 Major depressive disorder, recurrent, unspecified: Secondary | ICD-10-CM

## 2023-08-06 DIAGNOSIS — Z720 Tobacco use: Secondary | ICD-10-CM | POA: Diagnosis not present

## 2023-08-06 MED ORDER — WEGOVY 0.25 MG/0.5ML ~~LOC~~ SOAJ
0.2500 mg | SUBCUTANEOUS | 2 refills | Status: DC
Start: 2023-08-06 — End: 2023-09-06

## 2023-08-06 NOTE — Patient Instructions (Addendum)
It was wonderful to see you today. Thank you for allowing me to be a part of your care. Below is a short summary of what we discussed at your visit today: Your weight today is 227 pounds which is 4 pound loss from when I saw you 3 months ago.  BMI is 41.56.  For your weight loss of putting prescription for [facility] which will be sent to the pharmacy hoping that your insurance will approve of this.  Continue the exercise and diet as listed below.  All of these plans and follow-up with me in 1 month let us check your weight and see how you are doing.  Exercise goals: For substantial health benefits, adults should do at least 150 minutes (2 hours and 30 minutes) a week of moderate-intensity, or 75 minutes (1 hour and 15 minutes) a week of vigorous-intensity aerobic physical activity, or an equivalent combination of moderate- and vigorous-intensity aerobic activity. Aerobic activity should be performed in episodes of at least 10 minutes, and preferably, it should be spread throughout the week.   Behavioral modification strategies: - increasing water intake -Decreased Carb intake including cutting white bread, rice, pasta --Cut /  discontinue soda and Alcohol intake -Cut/ reduce tobacco smoking   Please make sure you closely follow-up with your therapist and your psychiatrist for your medications.  To schedule an appointment with our pharmacy team to discuss smoking cessation.  And schedule an appointment to follow-up with me in a month.   If you have any questions or concerns, please do not hesitate to contact us via phone or MyChart message.   Jerre Simon, MD [facility]

## 2023-08-06 NOTE — Assessment & Plan Note (Addendum)
Patient is currently in the initial stage of change. As such, her goal is to continue with weight loss efforts. She has agreed to work on exercise and diet adjustments.  BMI today is and recent A1c from 2 months ago was 5.6.  Obesity puts her at increased risk of further comorbidities including cardiovascular disease, hypertension, renal complications.     Exercise goals: For substantial health benefits, adults should do at least 150 minutes (2 hours and 30 minutes) a week of moderate-intensity, or 75 minutes (1 hour and 15 minutes) a week of vigorous-intensity aerobic physical activity, or an equivalent combination of moderate- and vigorous-intensity aerobic activity. Aerobic activity should be performed in episodes of at least 10 minutes, and preferably, it should be spread throughout the week.   Behavioral modification strategies:  - increasing water intake -Decreased Carb intake including cutting white bread, rice, pasta --Cut /  discontinue soda and Alcohol intake -Cut/ reduce tobacco smoking  Medication: -Rx Wegovy 0.25 mg weekly  Patient has agreed to follow-up with our clinic in 4 weeks. She was informed of the importance of frequent follow-up visits to maximize his success with intensive lifestyle modifications for his multiple health conditions.    Patient was informed we would discuss his lab results at his next visit unless there is a critical issue that needs to be addressed sooner. She agreed to keep his next visit at the agreed upon time to discuss these results.

## 2023-08-06 NOTE — Assessment & Plan Note (Signed)
Depression with elevated PHQ-9 of 22.  Question 9 of the screening test was negative.  No active SI/HI.  Exam generally pleasant young lady.  Recently started seeing therapist with first session on about 5 days ago and plan to continue every 2 weeks.  Also sees psychiatry who recently started on new medications including as needed hydroxyzine 25 mg and lamotrigine 25 mg for 14 days then 50 mg for additional 14 days. -Encourage patient to continue Zoloft, hydroxyzine and lamotrigine. -Continue follow-up with therapist -Continue psychiatry follow-up.

## 2023-08-06 NOTE — Assessment & Plan Note (Signed)
Currently smokes cigarettes and vapes.  Cannot vape shops makes quitting difficult the patient expressed desire to quit. -Recommend follow-up with Dr. Raymondo Band the pharmacy team for treatment options and management.

## 2023-08-06 NOTE — Progress Notes (Signed)
SUBJECTIVE:   CHIEF COMPLAINT / HPI:   Patient is a 37 year old female with past medical history is significant for morbid obesity, fibromyalgia, dysmenorrhea seen today to discuss weight loss options.  Her last BMI was 42.  Attempted weight loss in the past with exercising and diet but couldn't continue sue to life stressors. Not on any GLP-1 medications but expressed interest on GLP 1-1  Exercise level- Tries P90x but limited due to RLE pain and plantatia faciatis. 4 days a week   Dieting: Bad, eating regular diet but has cut down on fast food and take out. More veggies, fruits and white meat  Tobacco use: Smokes cigarettes and vapes daily.  Works at a [employer] and this had many difficult to quit.  However patient expressed interest in quitting smoking.  24 hours meal recall AM meal- Nothing Snacks-Nothing PM meal-Nothing Snacks-None Evening meal-Popeyes chicken, bisscuit Snack- potatoes chipsd hanfful How does this recall reflect normal daily meal: very common for patient  Depression Endorses having severe depression.  And recently started seeing therapist and had her first session about 5 days ago.  Sees a psychiatrist outpatient who continued her on Zoloft and added hydroxyzine for anxiety and lamotrigine to medication.  Denies active SI/HI and reports life stressors as major factors for jher depression within family relationships, finances and work.  PERTINENT  PMH / PSH: Reviewed  OBJECTIVE:   BP 124/87   Pulse 97   Ht 5\' 2"  (1.575 m)   Wt 227 lb 3.2 oz (103.1 kg)   LMP 07/08/2023 (Exact Date)   SpO2 97%   BMI 41.56 kg/m     Flowsheet Row Office Visit from 08/06/2023 in [facility]  PHQ-9 Total Score 24        Physical Exam General: Alert, well appearing, NAD, Oriented x4 Cardiovascular: RRR, No Murmurs, Normal S2/S2 Respiratory: CTAB, No wheezing or Rales Abdomen: No distension or tenderness Extremities: No edema on  extremities   Skin: Warm and dry  ASSESSMENT/PLAN:   Obesity (BMI 30-39.9) Patient is currently in the initial stage of change. As such, her goal is to continue with weight loss efforts. She has agreed to work on exercise and diet adjustments.  BMI today is and recent A1c from 2 months ago was 5.6.  Obesity puts her at increased risk of further comorbidities including cardiovascular disease, hypertension, renal complications.     Exercise goals: For substantial health benefits, adults should do at least 150 minutes (2 hours and 30 minutes) a week of moderate-intensity, or 75 minutes (1 hour and 15 minutes) a week of vigorous-intensity aerobic physical activity, or an equivalent combination of moderate- and vigorous-intensity aerobic activity. Aerobic activity should be performed in episodes of at least 10 minutes, and preferably, it should be spread throughout the week.   Behavioral modification strategies:  - increasing water intake -Decreased Carb intake including cutting white bread, rice, pasta --Cut /  discontinue soda and Alcohol intake -Cut/ reduce tobacco smoking  Medication: -Rx Wegovy 0.25 mg weekly  Patient has agreed to follow-up with our clinic in 4 weeks. She was informed of the importance of frequent follow-up visits to maximize his success with intensive lifestyle modifications for his multiple health conditions.    Patient was informed we would discuss his lab results at his next visit unless there is a critical issue that needs to be addressed sooner. She agreed to keep his next visit at the agreed upon time to discuss these  results.     Depression, recurrent (HCC) Depression with elevated PHQ-9 of 22.  Question 9 of the screening test was negative.  No active SI/HI.  Exam generally pleasant young lady.  Recently started seeing therapist with first session on about 5 days ago and plan to continue every 2 weeks.  Also sees psychiatry who recently started on new  medications including as needed hydroxyzine 25 mg and lamotrigine 25 mg for 14 days then 50 mg for additional 14 days. -Encourage patient to continue Zoloft, hydroxyzine and lamotrigine. -Continue follow-up with therapist -Continue psychiatry follow-up.  Tobacco use Currently smokes cigarettes and vapes.  Cannot vape shops makes quitting difficult the patient expressed desire to quit. -Recommend follow-up with Dr. Raymondo Deleon the pharmacy team for treatment options and management.    Follow-up in 4 weeks to check progress with weight loss.    Maria Simon, MD [facility]

## 2023-08-07 ENCOUNTER — Encounter: Payer: Self-pay | Admitting: Student

## 2023-08-16 ENCOUNTER — Ambulatory Visit: Payer: 59 | Admitting: Pharmacist

## 2023-08-17 ENCOUNTER — Telehealth: Payer: Self-pay

## 2023-08-17 ENCOUNTER — Other Ambulatory Visit (HOSPITAL_COMMUNITY): Payer: Self-pay

## 2023-08-17 NOTE — Telephone Encounter (Signed)
Attempted PA for Wegovy through patients insurance ([employer]).  PA decision: N/A  Per insurance: Your patient will pay 100% of a discounted price for this medication. Any amount the patient pays will not apply to their deductible or out-of-pocket expenses

## 2023-09-06 ENCOUNTER — Encounter: Payer: Self-pay | Admitting: Student

## 2023-09-06 ENCOUNTER — Telehealth: Payer: Self-pay

## 2023-09-06 ENCOUNTER — Ambulatory Visit: Payer: 59 | Admitting: Podiatry

## 2023-09-06 ENCOUNTER — Ambulatory Visit (INDEPENDENT_AMBULATORY_CARE_PROVIDER_SITE_OTHER): Payer: 59 | Admitting: Student

## 2023-09-06 ENCOUNTER — Encounter: Payer: Self-pay | Admitting: Podiatry

## 2023-09-06 VITALS — BP 102/80 | HR 98 | Ht 62.4 in | Wt 228.0 lb

## 2023-09-06 VITALS — Ht 62.0 in | Wt 227.2 lb

## 2023-09-06 DIAGNOSIS — M722 Plantar fascial fibromatosis: Secondary | ICD-10-CM

## 2023-09-06 DIAGNOSIS — F339 Major depressive disorder, recurrent, unspecified: Secondary | ICD-10-CM

## 2023-09-06 DIAGNOSIS — E669 Obesity, unspecified: Secondary | ICD-10-CM

## 2023-09-06 DIAGNOSIS — G5753 Tarsal tunnel syndrome, bilateral lower limbs: Secondary | ICD-10-CM | POA: Diagnosis not present

## 2023-09-06 DIAGNOSIS — M5417 Radiculopathy, lumbosacral region: Secondary | ICD-10-CM

## 2023-09-06 DIAGNOSIS — M2550 Pain in unspecified joint: Secondary | ICD-10-CM | POA: Diagnosis not present

## 2023-09-06 MED ORDER — NAPROXEN 500 MG PO TABS
500.0000 mg | ORAL_TABLET | Freq: Two times a day (BID) | ORAL | 2 refills | Status: AC
Start: 2023-09-06 — End: 2024-09-05

## 2023-09-06 MED ORDER — MOUNJARO 2.5 MG/0.5ML ~~LOC~~ SOAJ: 2.5000 mg | SUBCUTANEOUS | 2 refills | Status: AC

## 2023-09-06 MED ORDER — METHYLPREDNISOLONE 4 MG PO TBPK: ORAL_TABLET | ORAL | 0 refills | Status: AC

## 2023-09-06 NOTE — Progress Notes (Signed)
    SUBJECTIVE:   CHIEF COMPLAINT / HPI:   Patient is a 37 year old female presenting today for weight follow-up.    Last weight: 227lb 3.2 oz Weight today: 228lb Weight difference: None Weight goal: 140lb (weight before kids)  Life style changes Diet: cut down sweet drinks and more cautious with meals  Exercise: Unchanged sue to sever plantar fasciitis and Hip pain  Challenges: Difficult to stop bad habits with sugary drinks and high carb diet. Medication: Denied Wegovy, will try Mounjaro   PERTINENT  PMH / PSH: Reviewed   OBJECTIVE:   BP 102/80   Pulse 98   Ht 5' 2.4" (1.585 m)   Wt 228 lb (103.4 kg)   LMP 08/06/2023 (Approximate)   SpO2 99%   BMI 41.17 kg/m    Physical Exam General: Alert, morbidly obese, NAD Cardiovascular: RRR, No Murmurs, Normal S2/S2 Respiratory: CTAB, No wheezing or Rales Abdomen: No distension or tenderness Extremities: Right foot in orthopedic boot, tenderness over the left left later hip with palpation.  Muscle strength and sensation intact.  ASSESSMENT/PLAN:   Arthralgia Patient reports multiple arthralgias including her ankles and hip.  This has become a chronic problem and recently saw [facility] who is putting in a referral for [facility].  Today patient endorses pain in the hip, knees and ankle.  These are all chronic problem and on exam patient has mild tenderness over the lateral hip suspicious of possible bursitis.  Will treat with NSAID until she sees [facility].  Labs showed normal kidney function and patient has no history of gastritis or ulcer.  Sent in prescription for naproxen 500 mg which patient will take as needed for pain.  Obesity (BMI 30-39.9) Weight unchanged from a month ago.  Patient's biggest challenge is making changes to eating habits which include high carbs diet and sweet drinks.  Discussed with patient plan is for her to have 5 pound weight loss at follow-up appointment in a month.  Unfortunately  insurance denied [facility] but patient will like to try for [facility].  Due to her severe plantar fasciitis in the lower foot, exercising at this time we will be fairly limited. -Rx Mounjaro 2.5 mg weekly -Encourage patient to continue working on cutting out sweet drinks and high carb diets. -Will hold off exercising given plantar fasciitis - Plan is for 5 pound weight loss and a follow-up appointment. -Follow-up in 1 month  Depression, recurrent (HCC) PHQ score of 9 today.  No active SI/HI.  25 life stressors including current relationship as a contributors to her depressed mood.  Since improvement of symptoms on lamotrigine and currently seeing psychiatrist to recently prescribed lamotrigine and 50 mg daily.  It is also on Zoloft.  Discussed patient is exploring the option of Cymbalta given her history of fibromyalgia, generalized pain and nerve pain and discussed today as this could help with managing her depression and nerve pain.  Patient verbalized understanding and in agreement with plan.     Jerre Simon, MD [facility]

## 2023-09-06 NOTE — Assessment & Plan Note (Addendum)
PHQ score of 9 today.  No active SI/HI.  Identified life stressors including current relationship as a contributors to her depressed mood.  Since improvement of symptoms on lamotrigine and currently seeing psychiatrist to recently prescribed lamotrigine and 50 mg daily.  It is also on Zoloft.  Discussed patient is exploring the option of Cymbalta given her history of fibromyalgia, generalized pain and nerve pain and discussed today as this could help with managing her depression and nerve pain.  Patient verbalized understanding and in agreement with plan.

## 2023-09-06 NOTE — Telephone Encounter (Signed)
Referral, demographics and office note faxed to Dr. Ethelene Hal at [facility] [phone]

## 2023-09-06 NOTE — Patient Instructions (Signed)
Call [facility] to schedule your MRI at the below locations.  Please allow at least 1 business day after your visit to process the referral.  It may take longer depending on approval from insurance.  Please let me know if you have issues or problems scheduling the MRI   [facility] [phone] [address] [place], [place] [address]  [facility] [phone] [address] [place], [place] [address]  

## 2023-09-06 NOTE — Patient Instructions (Signed)
It was wonderful to see you today. Thank you for allowing me to be a part of your care. Below is a short summary of what we discussed at your visit today:  Today your weight is 228.  Discussed plan is for you to lose at least 5 pounds by your follow-up appointment in a month.  Continue to cut down and reduce your resumption of high carb diet such as white bread, rice, pasta and sweet drinks.  I have sent in prescription for naproxen which you will take for the hip and ankle pain that you are having until you see [facility].  Discussed possibly switching to Cymbalta which can help with your depression/anxiety and also your nerve pains.  Follow up in 1 month  If you have any questions or concerns, please do not hesitate to contact us via phone or MyChart message.   Jerre Simon, MD [facility]

## 2023-09-06 NOTE — Telephone Encounter (Signed)
-----   Message from Edwin Cap sent at 09/06/2023  3:24 PM EDT ----- Can you refer her to Dr Ethelene Hal for EMG/NCV at [facility]? LMK if she needs an actual referral order besides the EMG/NCV order

## 2023-09-06 NOTE — Assessment & Plan Note (Signed)
Weight unchanged from a month ago.  Patient's biggest challenge is making changes to eating habits which include high carbs diet and sweet drinks.  Discussed with patient plan is for her to have 5 pound weight loss at follow-up appointment in a month.  Unfortunately insurance denied [facility] but patient will like to try for [facility].  Due to her severe plantar fasciitis in the lower foot, exercising at this time we will be fairly limited. -Rx Mounjaro 2.5 mg weekly -Encourage patient to continue working on cutting out sweet drinks and high carb diets. -Will hold off exercising given plantar fasciitis - Plan is for 5 pound weight loss and a follow-up appointment. -Follow-up in 1 month

## 2023-09-06 NOTE — Progress Notes (Signed)
  Subjective:  Patient ID: Maria Deleon, female    DOB: [birth year],  MRN: [id number]  Chief Complaint  Patient presents with   Plantar Fasciitis    Pt states she has severe heel pain in right foot    Discussed the use of AI scribe software for clinical note transcription with the patient, who gave verbal consent to proceed.  History of Present Illness   The patient, with a history of right heel pain, presents for follow-up after a corticosteroid injection and use of a walking boot in July. She reports that the injection provided only temporary relief for about two weeks, and the pain has since returned. The pain has been particularly severe in the past few days due to increased time on her feet at work. The pain is described as excruciating and is located in the entire heel, sometimes radiating to the arch of the foot. She also reports swelling in the feet and legs, particularly at night. The left foot also causes discomfort, but not as severe as the right. The patient also reports a history of diverticulitis and is on pantoprazole for gastric reflux.          Objective:    Physical Exam   MUSCULOSKELETAL: Sharp pain on palpation of mid plantar fascia and mid arch. Pain with compression of heel and tarsal tunnel.       No images are attached to the encounter.    Results   RADIOLOGY MRI: No significant findings, unremarkable bilateral heel MRI (Fall 2023)      Assessment:   1. Plantar fasciitis   2. Lumbosacral radiculopathy   3. Tarsal tunnel syndrome of both lower extremities      Plan:  Patient was evaluated and treated and all questions answered.  Assessment and Plan    Plantar Fasciitis   Exacerbation of chronic heel pain, worse on the right side, has been noted, with prior treatments including corticosteroid injections and a walking boot providing only temporary relief. The pain is localized to the entire heel and mid-arch. We will order a new MRI to  re-evaluate the heel and refer her to Dr. Ethelene Hal at [facility] for nerve conduction studies. Prednisone will be started for one week to reduce inflammation, and the use of a walking boot will continue for support and pressure relief.  Muscle Cramps   She reports nocturnal leg cramps severe enough to disrupt sleep. We advise increasing water intake and recommend starting a magnesium supplement. Suggesting tonic water before bed may help with the cramps.  We will follow up in six weeks to evaluate the response to treatment and review the results of the MRI and nerve conduction studies.          No follow-ups on file.

## 2023-09-06 NOTE — Assessment & Plan Note (Signed)
Patient reports multiple arthralgias including her ankles and hip.  This has become a chronic problem and recently saw [facility] who is putting in a referral for [facility].  Today patient endorses pain in the hip, knees and ankle.  These are all chronic problem and on exam patient has mild tenderness over the lateral hip suspicious of possible bursitis.  Will treat with NSAID until she sees [facility].  Labs showed normal kidney function and patient has no history of gastritis or ulcer.  Sent in prescription for naproxen 500 mg which patient will take as needed for pain.

## 2023-09-19 ENCOUNTER — Telehealth: Payer: Self-pay

## 2023-09-19 ENCOUNTER — Other Ambulatory Visit (HOSPITAL_COMMUNITY): Payer: Self-pay

## 2023-09-19 NOTE — Telephone Encounter (Signed)
Pharmacy Patient Advocate Encounter   Received notification from Patient Advice Request messages that prior authorization for [facility] is required/requested.   PA required; PA started via CoverMyMeds. KEY [id number] . Waiting for clinical questions to populate.

## 2023-09-19 NOTE — Telephone Encounter (Signed)
Rec'd message from insurance PA for [facility]:   "Please note for this inquiry: Indications related to prediabetes, obesity or weight loss therapy are not approved by the FDA for the drug being requested. As a result, a pathway to approval will not be recommended for these indications."

## 2023-09-28 ENCOUNTER — Other Ambulatory Visit: Payer: 59

## 2023-10-22 ENCOUNTER — Ambulatory Visit (INDEPENDENT_AMBULATORY_CARE_PROVIDER_SITE_OTHER): Payer: 59 | Admitting: Podiatry

## 2023-10-22 DIAGNOSIS — Z91199 Patient's noncompliance with other medical treatment and regimen due to unspecified reason: Secondary | ICD-10-CM

## 2023-10-24 NOTE — Progress Notes (Signed)
Patient was no-show for appointment today 

## 2023-11-19 ENCOUNTER — Telehealth: Payer: 59 | Admitting: Family Medicine

## 2023-11-19 DIAGNOSIS — R102 Pelvic and perineal pain: Secondary | ICD-10-CM

## 2023-11-19 NOTE — Progress Notes (Signed)
Because Ms. Jarold Motto, I feel your condition warrants further evaluation and I recommend that you be seen in a face to face visit.   NOTE: There will be NO CHARGE for this eVisit   If you are having a true medical emergency please call 911.      For an urgent face to face visit, [facility] has eight urgent care centers for your convenience:   NEW!! [facility] Health Urgent Care Center at [facility] Get Driving Directions [phone] [address], [address] [place], [address]    [facility] Urgent Care Center at [facility] Get Driving Directions [phone] [address] [place], [place] [address]   [facility] Health Urgent Care Center [facility]) Get Driving Directions [phone] [address] [place], [place] [address]  [facility] Health Urgent Care Center [facility]) Get Driving Directions [phone] [address] [place],  [place]  [address]  [facility] Health Urgent Care Center [facility] - at [employer]  [phone] [place],  [place] [address]   [facility] Health Urgent Care at [facility] Get Driving Directions [phone] [address] [place], [place] [address]   [facility] Health Urgent Care at [facility] Get Driving Directions  [phone] [address] [place], [place] [address]   [facility] Health Urgent Care at [facility] Directions [phone] [address], [address] [place], [place] [address]  Your MyChart E-visit questionnaire answers were reviewed by a board certified advanced clinical practitioner to complete your personal care plan based on your specific symptoms.  Thank you for using e-Visits.   have provided 5 minutes of non face to face time during this encounter for chart review and documentation.

## 2024-04-28 ENCOUNTER — Ambulatory Visit

## 2024-04-28 NOTE — Progress Notes (Deleted)
    SUBJECTIVE:   CHIEF COMPLAINT / HPI:   Migraine  PERTINENT  PMH / PSH: ***  OBJECTIVE:   There were no vitals taken for this visit. ***  General: NAD, pleasant, able to participate in exam Cardiac: RRR, no murmurs. Respiratory: CTAB, normal effort, No wheezes, rales or rhonchi Abdomen: Bowel sounds present, nontender, nondistended Extremities: no edema or cyanosis. Skin: warm and dry, no rashes noted Neuro: alert, no obvious focal deficits Psych: Normal affect and mood  ASSESSMENT/PLAN:   No problem-specific Assessment & Plan notes found for this encounter.     Dr. Elberta Fortis, DO [facility]    {    This will disappear when note is signed, click to select method of visit    :1}

## 2024-08-18 ENCOUNTER — Ambulatory Visit: Admitting: Student

## 2024-09-08 ENCOUNTER — Ambulatory Visit: Admitting: Student
